# Patient Record
Sex: Female | Born: 1975 | Race: Black or African American | Hispanic: No | Marital: Married
Health system: Southern US, Community
[De-identification: ages and names within clinical notes are randomized; demographics above are authoritative.]

## PROBLEM LIST (undated history)

## (undated) ENCOUNTER — Inpatient Hospital Stay (HOSPITAL_COMMUNITY): Payer: Self-pay

## (undated) DIAGNOSIS — Z789 Other specified health status: Secondary | ICD-10-CM

## (undated) DIAGNOSIS — Z87898 Personal history of other specified conditions: Secondary | ICD-10-CM

## (undated) HISTORY — DX: Personal history of other specified conditions: Z87.898

## (undated) HISTORY — PX: NO PAST SURGERIES: SHX2092

---

## 2003-12-21 ENCOUNTER — Inpatient Hospital Stay (HOSPITAL_COMMUNITY): Admission: AD | Admit: 2003-12-21 | Discharge: 2003-12-21 | Payer: Self-pay | Admitting: Obstetrics & Gynecology

## 2003-12-24 ENCOUNTER — Inpatient Hospital Stay (HOSPITAL_COMMUNITY): Admission: AD | Admit: 2003-12-24 | Discharge: 2003-12-24 | Payer: Self-pay | Admitting: Obstetrics and Gynecology

## 2004-02-11 ENCOUNTER — Inpatient Hospital Stay (HOSPITAL_COMMUNITY): Admission: AD | Admit: 2004-02-11 | Discharge: 2004-02-11 | Payer: Self-pay | Admitting: Obstetrics & Gynecology

## 2004-09-11 ENCOUNTER — Emergency Department (HOSPITAL_COMMUNITY): Admission: EM | Admit: 2004-09-11 | Discharge: 2004-09-11 | Payer: Self-pay | Admitting: Podiatry

## 2008-05-31 ENCOUNTER — Emergency Department (HOSPITAL_COMMUNITY): Admission: EM | Admit: 2008-05-31 | Discharge: 2008-05-31 | Payer: Self-pay | Admitting: Emergency Medicine

## 2012-09-03 ENCOUNTER — Ambulatory Visit (INDEPENDENT_AMBULATORY_CARE_PROVIDER_SITE_OTHER): Payer: Managed Care, Other (non HMO) | Admitting: Family Medicine

## 2012-09-03 ENCOUNTER — Other Ambulatory Visit: Payer: Self-pay | Admitting: Family Medicine

## 2012-09-03 VITALS — BP 122/82 | HR 92 | Temp 98.4°F | Resp 16 | Ht 62.5 in | Wt 160.0 lb

## 2012-09-03 DIAGNOSIS — Z202 Contact with and (suspected) exposure to infections with a predominantly sexual mode of transmission: Secondary | ICD-10-CM

## 2012-09-03 LAB — HEPATITIS B SURFACE ANTIGEN: Hepatitis B Surface Ag: NEGATIVE

## 2012-09-03 MED ORDER — AZITHROMYCIN 500 MG PO TABS: 500.0000 mg | ORAL_TABLET | Freq: Every day | ORAL | Status: DC

## 2012-09-03 NOTE — Progress Notes (Addendum)
36 yo woman wanting STD check.  Partner diagnosed with chlamydia.  Patient has been having some spotting. No fever or abdominal pain.    Objective:  NAD  Vaginal exam: normal; no vulvar lesions, minimal discharge. Bimanual exam: normal  1. Exposure to STD  azithromycin (ZITHROMAX) 500 MG tablet, GC/Chlamydia Amp Probe, Genital, HSV(herpes simplex vrs) 1+2 ab-IgG, RPR, Hepatitis B surface antigen

## 2012-09-03 NOTE — Patient Instructions (Signed)
Chlamydia, Female Chlamydia is an infection caused by a certain type of germ (bacteria). The germs are spread from one person to another person during sexual contact. This infection can be in the cervix, urine tube (urethra), throat, or bottom (rectum). This infection needs treatment. HOME CARE   Take your medicines (antibiotics) as told. Finish them even if you start to feel better.   Only take medicine as told by your doctor.   Tell your sex partner(s) that you have chlamydia. They must also take medicine.   Do not have sex until your doctor says it is okay.   Rest.   Eat healthy. Drink enough fluids to keep your pee (urine) clear or pale yellow.   Keep all doctor visits as told.  GET HELP RIGHT AWAY IF:   Your problems return.   You have a fever.  MAKE SURE YOU:   Understand these instructions.   Will watch your condition.   Will get help right away if you are not doing well or get worse.  Document Released: 09/08/2008 Document Revised: 11/19/2011 Document Reviewed: 09/08/2008 [facility] Patient Information 2012 [place], [place].

## 2012-09-04 LAB — RPR

## 2012-09-05 LAB — GC/CHLAMYDIA PROBE AMP, GENITAL
Chlamydia, DNA Probe: NEGATIVE
GC Probe Amp, Genital: NEGATIVE

## 2012-09-05 LAB — HSV(HERPES SIMPLEX VRS) I + II AB-IGG
HSV 1 Glycoprotein G Ab, IgG: 10.37 IV — ABNORMAL HIGH
HSV 2 Glycoprotein G Ab, IgG: 0.36 IV

## 2012-09-06 ENCOUNTER — Telehealth: Payer: Self-pay

## 2012-09-06 NOTE — Telephone Encounter (Signed)
Dr. Elbert Ewings. 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 Pt's rx expires in Oct and wanted to know if we could write her a new one. Also pt wanted to know if we could add on a HIV test to her labs. She wanted to have everything checked. Please send this message back to the lab pool if that is ok so we can call [facility] to add it on. Thanks

## 2012-09-08 NOTE — Telephone Encounter (Signed)
Currently I am out of town. Ok to refill Valtrex for 1 year Ok to at [facility]

## 2012-09-09 LAB — HIV ANTIBODY (ROUTINE TESTING W REFLEX): HIV: NONREACTIVE

## 2012-09-09 MED ORDER — VALACYCLOVIR HCL 1 G PO TABS: ORAL_TABLET | ORAL | Status: DC

## 2012-09-09 NOTE — Telephone Encounter (Signed)
Spoke with Robin at [facility]. Test added.  

## 2012-09-09 NOTE — Telephone Encounter (Signed)
Called pharmacy to verify pt's current Rx for Valtrex and OK'd RF for 1 year, and entered into EPIC for records.  Lab please add on test per Dr L.

## 2013-09-28 ENCOUNTER — Other Ambulatory Visit: Payer: Self-pay | Admitting: Family Medicine

## 2013-10-14 ENCOUNTER — Other Ambulatory Visit: Payer: Self-pay | Admitting: Family Medicine

## 2013-10-25 ENCOUNTER — Telehealth: Payer: Self-pay | Admitting: Radiology

## 2013-10-25 NOTE — Telephone Encounter (Signed)
The following email was submitted to your website from [facility] hello, I had got a prescription filled with you guys about a year ago for Valtrex and my refills have ran out. When I got the prescription I was told that all I had to do was contact you guys to get another year prescribed. what do I need to do?

## 2013-10-25 NOTE — Telephone Encounter (Signed)
Looking in chart, she was ok'd for RF x 1 year. If she needs more she will need to come in and be seen. LMOM to inform her. When she calls back, please let patient know.  Thanks!  th

## 2013-10-27 NOTE — Telephone Encounter (Signed)
Training Anselm Pancoast. LMOM to call the office regarding her email that we received.

## 2013-10-27 NOTE — Telephone Encounter (Signed)
Patient called wanting a refill on Valtrex. She asked that her prescription be sent to [facility] on [address].  [phone]

## 2013-10-27 NOTE — Telephone Encounter (Signed)
She needs office visit. Called her to advise. She will come in next week.

## 2013-10-29 ENCOUNTER — Ambulatory Visit: Payer: Managed Care, Other (non HMO) | Admitting: Family Medicine

## 2013-10-29 VITALS — BP 122/76 | HR 86 | Temp 98.6°F | Resp 16 | Ht 63.0 in | Wt 182.0 lb

## 2013-10-29 DIAGNOSIS — B009 Herpesviral infection, unspecified: Secondary | ICD-10-CM

## 2013-10-29 DIAGNOSIS — B001 Herpesviral vesicular dermatitis: Secondary | ICD-10-CM

## 2013-10-29 MED ORDER — VALACYCLOVIR HCL 1 G PO TABS: ORAL_TABLET | ORAL | Status: DC

## 2013-10-29 NOTE — Progress Notes (Signed)
[facility]:  Office Visit  Chief Complaint:  Chief Complaint  Patient presents with  . Medication Refill    Valtrex    HPI: Denise Andrade is a 37 y.o. female who is here for  Medication refill meds for fever blisters, she takes it prn x 2-3 doses , no SEs, last major outbreak since on medication was 2 years ago. She has no symptoms currently.   History reviewed. No pertinent past medical history. History reviewed. No pertinent past surgical history. History   Social History  . Marital Status: Single    Spouse Name: N/A    Number of Children: N/A  . Years of Education: N/A   Social History Main Topics  . Smoking status: Never Smoker   . Smokeless tobacco: None  . Alcohol Use: Yes     Comment: social  . Drug Use: None  . Sexual Activity: Yes    Partners: Male   Other Topics Concern  . None   Social History Narrative  . None   Family History  Problem Relation Age of Onset  . Arthritis Mother   . Hyperlipidemia Mother   . ALS Mother   . Thyroid disease Mother   . Heart attack Father    No Known Allergies Prior to Admission medications   Medication Sig Start Date End Date Taking? Authorizing Provider  valACYclovir (VALTREX) 1000 MG tablet Take 2 tablets by mouth twice a day 1 DAY if needed. PATIENT NEEDS OFFICE VISIT FOR ADDITIONAL REFILLS 09/28/13  Yes Elvina Sidle, MD     ROS: The patient denies fevers, chills, night sweats, unintentional weight loss, chest pain, palpitations, wheezing, dyspnea on exertion, nausea, vomiting, abdominal pain, dysuria, hematuria, melena, numbness, weakness, or tingling.   All other systems have been reviewed and were otherwise negative with the exception of those mentioned in the HPI and as above.    PHYSICAL EXAM: Filed Vitals:   10/29/13 1307  BP: 122/76  Pulse: 86  Temp: 98.6 F (37 C)  Resp: 16   Filed Vitals:   10/29/13 1307  Height: 5\' 3"  (1.6 m)  Weight: 182 lb (82.555 kg)   Body mass index  is 32.25 kg/(m^2).  General: Alert, no acute distress HEENT:  Normocephalic, atraumatic, oropharynx patent. EOMI, PERRLA Cardiovascular:  Regular rate and rhythm, no rubs murmurs or gallops.  No Carotid bruits, radial pulse intact. No pedal edema.  Respiratory: Clear to auscultation bilaterally.  No wheezes, rales, or rhonchi.  No cyanosis, no use of accessory musculature GI: No organomegaly, abdomen is soft and non-tender, positive bowel sounds.  No masses. Skin: No rashes. Neurologic: Facial musculature symmetric. Psychiatric: Patient is appropriate throughout our interaction. Lymphatic: No cervical lymphadenopathy Musculoskeletal: Gait intact.   LABS: Results for orders placed in visit on 09/03/12  GC/CHLAMYDIA PROBE AMP, GENITAL      Result Value Range   Chlamydia, DNA Probe NEGATIVE     GC Probe Amp, Genital NEGATIVE    HSV(HERPES SIMPLEX VRS) I + II AB-IGG      Result Value Range   HSV 1 Glycoprotein G Ab, IgG 10.37 (*)    HSV 2 Glycoprotein G Ab, IgG 0.36    RPR      Result Value Range   RPR NON REAC  NON REAC  HEPATITIS B SURFACE ANTIGEN      Result Value Range   Hepatitis B Surface Ag NEGATIVE  NEGATIVE     EKG/XRAY:   Primary read interpreted by Dr. Conley Rolls  at [facility].   ASSESSMENT/PLAN: Encounter Diagnosis  Name Primary?  . Fever blister Yes    Refilled Valtrex May try Lysine otc as prevention as well F/u prn Gross sideeffects, risk and benefits, and alternatives of medications d/w patient. Patient is aware that all medications have potential sideeffects and we are unable to predict every sideeffect or drug-drug interaction that may occur.  Parveen Freehling PHUONG, DO 10/29/2013 1:18 PM

## 2014-03-04 ENCOUNTER — Other Ambulatory Visit: Payer: Self-pay | Admitting: Family Medicine

## 2014-05-10 ENCOUNTER — Telehealth: Payer: Self-pay | Admitting: Nurse Practitioner

## 2014-05-10 DIAGNOSIS — Z30432 Encounter for removal of intrauterine contraceptive device: Secondary | ICD-10-CM

## 2014-05-10 NOTE — Telephone Encounter (Signed)
Spoke with patient. Patient states that she would like to get her IUD removed at this time. Patient states that she has been experiencing "skin breakdown" on her back, shoulders, and chin since she had the IUD placed. Patient has tried treatment for dry skin and acne with no relief. Patient also states that after insertion of the IUD she has been unable to lose weight. Mirena IUD was inserted by Dr.Romine on 10/10/2012. Patient states "I just think my body needs a break and then I will consider other options later." Appointment scheduled with Dr.Lathrop for June 1st at 1300. Patient agreeable to date and time. Order entered for precert.  Routing to provider for final review. Patient agreeable to disposition. Will close encounter  Routing to Dr.Lathrop CC: Lauro Franklin, FNP

## 2014-05-10 NOTE — Telephone Encounter (Signed)
Patient wants to get IUD removed

## 2014-05-14 ENCOUNTER — Encounter: Payer: Self-pay | Admitting: Gynecology

## 2014-05-14 ENCOUNTER — Ambulatory Visit (INDEPENDENT_AMBULATORY_CARE_PROVIDER_SITE_OTHER): Payer: Managed Care, Other (non HMO) | Admitting: Gynecology

## 2014-05-14 VITALS — BP 130/68 | Resp 18 | Ht 63.0 in | Wt 194.0 lb

## 2014-05-14 DIAGNOSIS — Z309 Encounter for contraceptive management, unspecified: Secondary | ICD-10-CM

## 2014-05-14 DIAGNOSIS — Z30432 Encounter for removal of intrauterine contraceptive device: Secondary | ICD-10-CM

## 2014-05-14 MED ORDER — NORETHIN ACE-ETH ESTRAD-FE 1-20 MG-MCG PO TABS: 1.0000 | ORAL_TABLET | Freq: Every day | ORAL | Status: AC

## 2014-05-14 NOTE — Progress Notes (Signed)
38 yrs Hispanic Single [id number]      Presents for Mirena removal.  Denies any vaginal symptoms or STD concerns.  Plans for contraception areunsure.  Pt reports issues with skin breakouts and feeling of breast heaviness.  Pt has had IUD for almost 2y, no cycles. Pt has used yasmin in past but only took for a month.  LMP: none       HPI neg.  Exam: time, place and person Abdomen: soft non-tender Groin:no inguinal nodes palpated    Pelvic exam:Pelvic exam: normal external genitalia, vulva, vagina, cervix, uterus and adnexa.  Procedure: Speculum placed, cervix visualized.  IUD string visualized, grasp with ring forceps, with gentle traction IUD removed intact.  IUD shown to patient and discarded. Speculum removed.   Assessment:Mirena removal Pt tolerated procedure well.  Plan: Begin contraceptive choice oforal contraceptives Rxyes    Questions addressed. We reviewed alternative contraceptive options, pt would like to try ocp-will either start now or wait for cycle-pt aware that removal is associated with immediate return to fertility  Return Visit: 82m

## 2014-05-14 NOTE — Patient Instructions (Addendum)
Begin ocp today or can wait for cycle, if start today, use back-up condoms for first month Start first day of flow

## 2014-09-14 ENCOUNTER — Telehealth: Payer: Self-pay | Admitting: Gynecology

## 2014-09-14 ENCOUNTER — Encounter: Payer: Self-pay | Admitting: Gynecology

## 2014-09-14 ENCOUNTER — Ambulatory Visit: Payer: Managed Care, Other (non HMO) | Admitting: Gynecology

## 2014-09-14 NOTE — Telephone Encounter (Signed)
Patient dnka her recheck appointment with Dr.Lathrop today. I left a message for patient to call and reschedule.

## 2014-09-24 ENCOUNTER — Other Ambulatory Visit: Payer: Self-pay | Admitting: Family Medicine

## 2014-10-15 ENCOUNTER — Encounter: Payer: Self-pay | Admitting: Gynecology

## 2015-04-29 ENCOUNTER — Other Ambulatory Visit: Payer: Self-pay | Admitting: *Deleted

## 2015-04-29 NOTE — Telephone Encounter (Signed)
Faxed Medication refill request: Gildess FE 1/20 Last AEX:  09/22/12 with CR Next AEX: No AEX scheduled  Last MMG (if hormonal medication request): N/A Refill authorized: ?  Left Message To Call Back

## 2015-05-02 NOTE — Telephone Encounter (Signed)
Left Message To Call Back  

## 2015-05-03 NOTE — Telephone Encounter (Signed)
No callback x2 ; Rx denied through epic, patient needs AEX before further refills.

## 2017-11-05 ENCOUNTER — Inpatient Hospital Stay (HOSPITAL_COMMUNITY)
Admission: AD | Admit: 2017-11-05 | Discharge: 2017-11-05 | Disposition: A | Discharge status: Home / Self Care | Payer: 59 | Source: Ambulatory Visit | Attending: Obstetrics and Gynecology | Admitting: Obstetrics and Gynecology

## 2017-11-05 ENCOUNTER — Encounter (HOSPITAL_COMMUNITY): Payer: Self-pay | Admitting: *Deleted

## 2017-11-05 DIAGNOSIS — Z3A33 33 weeks gestation of pregnancy: Secondary | ICD-10-CM | POA: Diagnosis not present

## 2017-11-05 DIAGNOSIS — O26893 Other specified pregnancy related conditions, third trimester: Secondary | ICD-10-CM | POA: Insufficient documentation

## 2017-11-05 DIAGNOSIS — O4703 False labor before 37 completed weeks of gestation, third trimester: Secondary | ICD-10-CM

## 2017-11-05 DIAGNOSIS — R109 Unspecified abdominal pain: Secondary | ICD-10-CM | POA: Insufficient documentation

## 2017-11-05 DIAGNOSIS — O133 Gestational [pregnancy-induced] hypertension without significant proteinuria, third trimester: Secondary | ICD-10-CM | POA: Diagnosis not present

## 2017-11-05 HISTORY — DX: Other specified health status: Z78.9

## 2017-11-05 LAB — URINALYSIS, ROUTINE W REFLEX MICROSCOPIC
Bilirubin Urine: NEGATIVE
GLUCOSE, UA: NEGATIVE mg/dL
Hgb urine dipstick: NEGATIVE
KETONES UR: NEGATIVE mg/dL
LEUKOCYTES UA: NEGATIVE
Nitrite: NEGATIVE
PH: 6 (ref 5.0–8.0)
Protein, ur: NEGATIVE mg/dL
Specific Gravity, Urine: 1.017 (ref 1.005–1.030)

## 2017-11-05 LAB — PROTEIN / CREATININE RATIO, URINE: CREATININE, URINE: 31 mg/dL

## 2017-11-05 LAB — COMPREHENSIVE METABOLIC PANEL
ALT: 27 U/L (ref 14–54)
AST: 41 U/L (ref 15–41)
Albumin: 2.5 g/dL — ABNORMAL LOW (ref 3.5–5.0)
Alkaline Phosphatase: 155 U/L — ABNORMAL HIGH (ref 38–126)
Anion gap: 6 (ref 5–15)
BILIRUBIN TOTAL: 0.4 mg/dL (ref 0.3–1.2)
BUN: 12 mg/dL (ref 6–20)
CHLORIDE: 110 mmol/L (ref 101–111)
CO2: 19 mmol/L — ABNORMAL LOW (ref 22–32)
CREATININE: 0.56 mg/dL (ref 0.44–1.00)
Calcium: 8.8 mg/dL — ABNORMAL LOW (ref 8.9–10.3)
Glucose, Bld: 87 mg/dL (ref 65–99)
Potassium: 3.8 mmol/L (ref 3.5–5.1)
Sodium: 135 mmol/L (ref 135–145)
TOTAL PROTEIN: 6.8 g/dL (ref 6.5–8.1)

## 2017-11-05 LAB — CBC
HEMATOCRIT: 38.9 % (ref 36.0–46.0)
Hemoglobin: 12.8 g/dL (ref 12.0–15.0)
MCH: 30.8 pg (ref 26.0–34.0)
MCHC: 32.9 g/dL (ref 30.0–36.0)
MCV: 93.7 fL (ref 78.0–100.0)
PLATELETS: 233 10*3/uL (ref 150–400)
RBC: 4.15 MIL/uL (ref 3.87–5.11)
RDW: 12.8 % (ref 11.5–15.5)
WBC: 10.3 10*3/uL (ref 4.0–10.5)

## 2017-11-05 LAB — FETAL FIBRONECTIN: Fetal Fibronectin: NEGATIVE

## 2017-11-05 MED ORDER — NIFEDIPINE 10 MG PO CAPS
10.0000 mg | ORAL_CAPSULE | ORAL | Status: DC | PRN
Start: 2017-11-05 — End: 2017-11-05
  Administered 2017-11-05 (×3): 10 mg via ORAL
  Filled 2017-11-05 (×3): qty 1

## 2017-11-05 NOTE — MAU Note (Signed)
Pt C/O lower abd pain, cramping, constant, worse on RLQ, also pressure.  She thinks her baby may have moved, was breech, thinks he may have turned.  Denies bleeding or LOF.

## 2017-11-05 NOTE — Discharge Instructions (Signed)
Preterm Labor and Birth Information The normal length of a pregnancy is 39-41 weeks. Preterm labor is when labor starts before 37 completed weeks of pregnancy. What are the risk factors for preterm labor? Preterm labor is more likely to occur in women who:  Have certain infections during pregnancy such as a bladder infection, sexually transmitted infection, or infection inside the uterus (chorioamnionitis).  Have a shorter-than-normal cervix.  Have gone into preterm labor before.  Have had surgery on their cervix.  Are younger than age 89 or older than age 19.  Are African American.  Are pregnant with twins or multiple babies (multiple gestation).  Take street drugs or smoke while pregnant.  Do not gain enough weight while pregnant.  Became pregnant shortly after having been pregnant.  What are the symptoms of preterm labor? Symptoms of preterm labor include:  Cramps similar to those that can happen during a menstrual period. The cramps may happen with diarrhea.  Pain in the abdomen or lower back.  Regular uterine contractions that may feel like tightening of the abdomen.  A feeling of increased pressure in the pelvis.  Increased watery or bloody mucus discharge from the vagina.  Water breaking (ruptured amniotic sac).  Why is it important to recognize signs of preterm labor? It is important to recognize signs of preterm labor because babies who are born prematurely may not be fully developed. This can put them at an increased risk for:  Long-term (chronic) heart and lung problems.  Difficulty immediately after birth with regulating body systems, including blood sugar, body temperature, heart rate, and breathing rate.  Bleeding in the brain.  Cerebral palsy.  Learning difficulties.  Death.  These risks are highest for babies who are born before 37 weeks of pregnancy. How is preterm labor treated? Treatment depends on the length of your pregnancy, your  condition, and the health of your baby. It may involve:  Having a stitch (suture) placed in your cervix to prevent your cervix from opening too early (cerclage).  Taking or being given medicines, such as: ? Hormone medicines. These may be given early in pregnancy to help support the pregnancy. ? Medicine to stop contractions. ? Medicines to help mature the babys lungs. These may be prescribed if the risk of delivery is high. ? Medicines to prevent your baby from developing cerebral palsy.  If the labor happens before 34 weeks of pregnancy, you may need to stay in the hospital. What should I do if I think I am in preterm labor? If you think that you are going into preterm labor, call your health care provider right away. How can I prevent preterm labor in future pregnancies? To increase your chance of having a full-term pregnancy:  Do not use any tobacco products, such as cigarettes, chewing tobacco, and e-cigarettes. If you need help quitting, ask your health care provider.  Do not use street drugs or medicines that have not been prescribed to you during your pregnancy.  Talk with your health care provider before taking any herbal supplements, even if you have been taking them regularly.  Make sure you gain a healthy amount of weight during your pregnancy.  Watch for infection. If you think that you might have an infection, get it checked right away.  Make sure to tell your health care provider if you have gone into preterm labor before.  This information is not intended to replace advice given to you by your health care provider. Make sure you discuss any questions  you have with your health care provider. Document Released: 02/20/2004 Document Revised: 05/12/2016 Document Reviewed: 04/22/2016 Elsevier Interactive Patient Education  2018 [employer]    Hypertension During Pregnancy Hypertension is also called high blood pressure. High blood pressure means that the force of your  blood moving in your body is too strong. When you are pregnant, this condition should be watched carefully. It can cause problems for you and your baby. Follow these instructions at home: Eating and drinking  Drink enough fluid to keep your pee (urine) clear or pale yellow.  Eat healthy foods that are low in salt (sodium). ? Do not add salt to your food. ? Check labels on foods and drinks to see much salt is in them. Look on the label where you see "Sodium." Lifestyle  Do not use any products that contain nicotine or tobacco, such as cigarettes and e-cigarettes. If you need help quitting, ask your doctor.  Do not use alcohol.  Avoid caffeine.  Avoid stress. Rest and get plenty of sleep. General instructions  Take over-the-counter and prescription medicines only as told by your doctor.  While lying down, lie on your left side. This keeps pressure off your baby.  While sitting or lying down, raise (elevate) your feet. Try putting some pillows under your lower legs.  Exercise regularly. Ask your doctor what kinds of exercise are best for you.  Keep all prenatal and follow-up visits as told by your doctor. This is important. Contact a doctor if:  You have symptoms that your doctor told you to watch for, such as: ? Fever. ? Throwing up (vomiting). ? Headache. Get help right away if:  You have very bad pain in your belly (abdomen).  You are throwing up, and this does not get better with treatment.  You suddenly get swelling in your hands, ankles, or face.  You gain 4 lb (1.8 kg) or more in 1 week.  You get bleeding from your vagina.  You have blood in your pee.  You do not feel your baby moving as much as normal.  You have a change in vision.  You have muscle twitching or sudden tightening (spasms).  You have trouble breathing.  Your lips or fingernails turn blue. This information is not intended to replace advice given to you by your health care provider. Make  sure you discuss any questions you have with your health care provider. Document Released: 01/02/2011 Document Revised: 08/11/2016 Document Reviewed: 08/11/2016 Elsevier Interactive Patient Education  2017 [employer]

## 2017-11-05 NOTE — MAU Provider Note (Signed)
Chief Complaint  Patient presents with  . Abdominal Pain     First Provider Initiated Contact with Patient 11/05/17 0958      S: Marcellus ScottGina L Spizzirri  is a 41 y.o. y.o. year old [id number] female at 391w4d weeks gestation who presents to MAU with low abd and low back cramping and incidentally found to have elevated blood pressures. Having persistent cramp/pressure in right groin that makes it difficult to work. Possible isolated elevated BP at last prenatal visit. Pt doesn's remember number. [facility]. Current blood pressure medication: None  Associated symptoms: Neg for Headache, vision changes, epigastric pain Contractions: Unsure Vaginal bleeding: Denies Fetal movement: Nml  O:  Patient Vitals for the past 24 hrs:  BP Temp Temp src Pulse Resp  11/05/17 1200 112/70 - - 88 -  11/05/17 1145 117/67 - - 86 -  11/05/17 1130 125/70 - - 90 -  11/05/17 1115 124/74 - - 79 -  11/05/17 1108 136/76 - - 71 -  11/05/17 1045 106/66 - - 78 -  11/05/17 1031 (!) 103/57 - - 81 -  11/05/17 1015 126/74 - - 85 -  11/05/17 1006 (!) 142/85 - - 82 -  11/05/17 0934 132/81 - - 90 -  11/05/17 0923 (!) 144/74 98.3 F (36.8 C) Oral 91 16   General: NAD Heart: Regular rate Lungs: Normal rate and effort Abd: Soft, NT, Gravid, S=D Extremities: Neg Pedal edema Neuro: 2+ deep tendon reflexes, No clonus Pelvic: NEFG, no bleeding or LOF.   Dilation: Closed Effacement (%): Thick Cervical Position: Posterior(maternal right) Station: Ballotable Presentation: Vertex(slightly oblique. Head to maternal right. ) Exam by:: Ivonne AndrewV. Tatiyanna Lashley CNM  EFM: 145, Moderate variability, 15 x 15 accelerations, no decelerations Toco: Q2-4 , mild  Results for orders placed or performed during the hospital encounter of 11/05/17 (from the past 24 hour(s))  Urinalysis, Routine w reflex microscopic     Status: Abnormal   Collection Time: 11/05/17  9:00 AM  Result Value Ref Range   Color, Urine AMBER (A) YELLOW   APPearance  CLOUDY (A) CLEAR   Specific Gravity, Urine 1.017 1.005 - 1.030   pH 6.0 5.0 - 8.0   Glucose, UA NEGATIVE NEGATIVE mg/dL   Hgb urine dipstick NEGATIVE NEGATIVE   Bilirubin Urine NEGATIVE NEGATIVE   Ketones, ur NEGATIVE NEGATIVE mg/dL   Protein, ur NEGATIVE NEGATIVE mg/dL   Nitrite NEGATIVE NEGATIVE   Leukocytes, UA NEGATIVE NEGATIVE  Fetal fibronectin     Status: None   Collection Time: 11/05/17 10:05 AM  Result Value Ref Range   Fetal Fibronectin NEGATIVE NEGATIVE  CBC     Status: None   Collection Time: 11/05/17 10:12 AM  Result Value Ref Range   WBC 10.3 4.0 - 10.5 K/uL   RBC 4.15 3.87 - 5.11 MIL/uL   Hemoglobin 12.8 12.0 - 15.0 g/dL   HCT 16.138.9 09.636.0 - 04.546.0 %   MCV 93.7 78.0 - 100.0 fL   MCH 30.8 26.0 - 34.0 pg   MCHC 32.9 30.0 - 36.0 g/dL   RDW 40.912.8 81.111.5 - 91.415.5 %   Platelets 233 150 - 400 K/uL  Comprehensive metabolic panel     Status: Abnormal   Collection Time: 11/05/17 10:12 AM  Result Value Ref Range   Sodium 135 135 - 145 mmol/L   Potassium 3.8 3.5 - 5.1 mmol/L   Chloride 110 101 - 111 mmol/L   CO2 19 (L) 22 - 32 mmol/L   Glucose, Bld 87  65 - 99 mg/dL   BUN 12 6 - 20 mg/dL   Creatinine, Ser 8.650.56 0.44 - 1.00 mg/dL   Calcium 8.8 (L) 8.9 - 10.3 mg/dL   Total Protein 6.8 6.5 - 8.1 g/dL   Albumin 2.5 (L) 3.5 - 5.0 g/dL   AST 41 15 - 41 U/L   ALT 27 14 - 54 U/L   Alkaline Phosphatase 155 (H) 38 - 126 U/L   Total Bilirubin 0.4 0.3 - 1.2 mg/dL   GFR calc non Af Amer >60 >60 mL/min   GFR calc Af Amer >60 >60 mL/min   Anion gap 6 5 - 15  Protein / creatinine ratio, urine     Status: None   Collection Time: 11/05/17 11:00 AM  Result Value Ref Range   Creatinine, Urine 31.00 mg/dL   Total Protein, Urine <6 mg/dL   Protein Creatinine Ratio        0.00 - 0.15 mg/mg[Cre]   MAU Course/MDM Orders Placed This Encounter  Procedures  . Urinalysis, Routine w reflex microscopic  . Fetal fibronectin  . CBC  . Comprehensive metabolic panel  . Protein / creatinine ratio,  urine  . Discharge patient   Meds ordered this encounter  Medications  . NIFEdipine (PROCARDIA) capsule 10 mg for contractions (given after multiple BP's taken)   A: 9018w4d week IUP Preterm contractions w/ out evidence of active preterm labor. fFN neg.  Transient hypertension in pregnancy w/out evidence of Pre-E FHR reactive  P: Discharge home in stable condition Preeclampsia precautions. PTL precautions.  Increase fluids and rest [facility] sooner as needed if symptoms worsen. Return to maternity admissions as needed in emergencies  [place], [place], [place] 11/05/2017 12:15 PM

## 2017-11-15 LAB — OB RESULTS CONSOLE GC/CHLAMYDIA
Chlamydia: NEGATIVE
GC PROBE AMP, GENITAL: NEGATIVE

## 2017-12-14 ENCOUNTER — Encounter (HOSPITAL_COMMUNITY): Payer: Self-pay

## 2017-12-14 ENCOUNTER — Other Ambulatory Visit: Payer: Self-pay

## 2017-12-14 ENCOUNTER — Inpatient Hospital Stay (HOSPITAL_COMMUNITY)
Admission: AD | Admit: 2017-12-14 | Discharge: 2017-12-14 | Disposition: A | Discharge status: Home / Self Care | Payer: 59 | Source: Ambulatory Visit | Attending: Family Medicine | Admitting: Family Medicine

## 2017-12-14 DIAGNOSIS — R51 Headache: Secondary | ICD-10-CM | POA: Diagnosis present

## 2017-12-14 DIAGNOSIS — Z87891 Personal history of nicotine dependence: Secondary | ICD-10-CM | POA: Insufficient documentation

## 2017-12-14 DIAGNOSIS — Z3A39 39 weeks gestation of pregnancy: Secondary | ICD-10-CM | POA: Insufficient documentation

## 2017-12-14 DIAGNOSIS — G44209 Tension-type headache, unspecified, not intractable: Secondary | ICD-10-CM

## 2017-12-14 DIAGNOSIS — O9989 Other specified diseases and conditions complicating pregnancy, childbirth and the puerperium: Secondary | ICD-10-CM | POA: Diagnosis not present

## 2017-12-14 DIAGNOSIS — O26893 Other specified pregnancy related conditions, third trimester: Secondary | ICD-10-CM | POA: Diagnosis not present

## 2017-12-14 LAB — CBC WITH DIFFERENTIAL/PLATELET
BASOS ABS: 0 10*3/uL (ref 0.0–0.1)
BASOS PCT: 0 %
EOS ABS: 0.1 10*3/uL (ref 0.0–0.7)
EOS PCT: 1 %
HCT: 37.3 % (ref 36.0–46.0)
Hemoglobin: 12.4 g/dL (ref 12.0–15.0)
Lymphocytes Relative: 20 %
Lymphs Abs: 2 10*3/uL (ref 0.7–4.0)
MCH: 30.5 pg (ref 26.0–34.0)
MCHC: 33.2 g/dL (ref 30.0–36.0)
MCV: 91.6 fL (ref 78.0–100.0)
MONO ABS: 0.4 10*3/uL (ref 0.1–1.0)
MONOS PCT: 4 %
Neutro Abs: 7.6 10*3/uL (ref 1.7–7.7)
Neutrophils Relative %: 75 %
PLATELETS: 261 10*3/uL (ref 150–400)
RBC: 4.07 MIL/uL (ref 3.87–5.11)
RDW: 13.4 % (ref 11.5–15.5)
WBC: 10.2 10*3/uL (ref 4.0–10.5)

## 2017-12-14 LAB — URINALYSIS, ROUTINE W REFLEX MICROSCOPIC
BILIRUBIN URINE: NEGATIVE
GLUCOSE, UA: NEGATIVE mg/dL
Hgb urine dipstick: NEGATIVE
KETONES UR: NEGATIVE mg/dL
LEUKOCYTES UA: NEGATIVE
Nitrite: NEGATIVE
PH: 5 (ref 5.0–8.0)
PROTEIN: NEGATIVE mg/dL
Specific Gravity, Urine: 1.023 (ref 1.005–1.030)

## 2017-12-14 LAB — COMPREHENSIVE METABOLIC PANEL
ALBUMIN: 2.5 g/dL — AB (ref 3.5–5.0)
ALT: 28 U/L (ref 14–54)
ANION GAP: 11 (ref 5–15)
AST: 21 U/L (ref 15–41)
Alkaline Phosphatase: 236 U/L — ABNORMAL HIGH (ref 38–126)
BUN: 11 mg/dL (ref 6–20)
CHLORIDE: 106 mmol/L (ref 101–111)
CO2: 19 mmol/L — AB (ref 22–32)
Calcium: 8.7 mg/dL — ABNORMAL LOW (ref 8.9–10.3)
Creatinine, Ser: 0.74 mg/dL (ref 0.44–1.00)
GFR calc Af Amer: >60 mL/min (ref >60)
GFR calc non Af Amer: >60 mL/min (ref >60)
GLUCOSE: 86 mg/dL (ref 65–99)
POTASSIUM: 3.7 mmol/L (ref 3.5–5.1)
SODIUM: 136 mmol/L (ref 135–145)
Total Bilirubin: 0.4 mg/dL (ref 0.3–1.2)
Total Protein: 6.9 g/dL (ref 6.5–8.1)

## 2017-12-14 LAB — PROTEIN / CREATININE RATIO, URINE
Creatinine, Urine: 157 mg/dL
PROTEIN CREATININE RATIO: 0.13 mg/mg{creat} (ref 0.00–0.15)
Total Protein, Urine: 21 mg/dL

## 2017-12-14 MED ORDER — ACETAMINOPHEN 500 MG PO TABS
1000.0000 mg | ORAL_TABLET | Freq: Once | ORAL | Status: AC
  Administered 2017-12-14: 1000 mg via ORAL
  Filled 2017-12-14: qty 2

## 2017-12-14 NOTE — MAU Provider Note (Signed)
History     CSN: [id number]  Arrival date and time: 12/14/17 1759   First Provider Initiated Contact with Patient 12/14/17 1825      Chief Complaint  Patient presents with  . Headache   HPI [name] is a 42 y.o. [phone] at [redacted]w[redacted]d who presents to MAU today with complaint of headache. She states that they have been "watching" her blood pressure in the office recently. She goes to the [facility] for prenatal care. She rates pain at 5/10. Present since this morning. She has not taken anything for pain. She denies blurred vision, RUQ abdominal pain or edema. She denies vaginal bleeding, LOF or regular contractions. She reports normal fetal movement.   OB History    Gravida Para Term Preterm AB Living   4 3 3     3    SAB TAB Ectopic Multiple Live Births                  Past Medical History:  Diagnosis Date  . Medical history non-contributory     Past Surgical History:  Procedure Laterality Date  . NO PAST SURGERIES      History reviewed. No pertinent family history.  Social History   Tobacco Use  . Smoking status: Former Games developer  . Smokeless tobacco: Never Used  Substance Use Topics  . Alcohol use: No    Frequency: Never  . Drug use: No    Allergies: No Known Allergies  Medications Prior to Admission  Medication Sig Dispense Refill Last Dose  . Prenat-FeFum-FePo-FA-Omega 3 (VIRT-C DHA) 53.5-38-1 MG CAPS Take 1 capsule by mouth daily.   12/14/2017 at Unknown time  . acetaminophen (TYLENOL) 325 MG tablet Take 325 mg by mouth every 6 (six) hours as needed for mild pain or headache.    11/04/2017 at Unknown time    Review of Systems  Constitutional: Negative for fever.  Eyes: Negative for visual disturbance.  Cardiovascular: Negative for leg swelling.  Gastrointestinal: Negative for abdominal pain, constipation, diarrhea, nausea and vomiting.  Neurological: Negative for headaches.   Physical Exam   Blood pressure 122/78, pulse 94, temperature 98.3 F (36.8 C),  resp. rate 18, height 5\' 2"  (1.575 m), weight 233 lb (105.7 kg), SpO2 100 %.  Physical Exam  Nursing note and vitals reviewed. Constitutional: She is oriented to person, place, and time. She appears well-developed and well-nourished. No distress.  HENT:  Head: Normocephalic and atraumatic.  Cardiovascular: Normal rate.  Respiratory: Effort normal.  GI: Soft. She exhibits no distension and no mass. There is no tenderness. There is no rebound and no guarding.  Musculoskeletal: She exhibits no edema.  Neurological: She is alert and oriented to person, place, and time. She has normal reflexes.  No clonus  Skin: Skin is warm and dry. No erythema.  Psychiatric: She has a normal mood and affect.    Results for orders placed or performed during the hospital encounter of 12/14/17 (from the past 24 hour(s))  Urinalysis, Routine w reflex microscopic     Status: Abnormal   Collection Time: 12/14/17  6:03 PM  Result Value Ref Range   Color, Urine YELLOW YELLOW   APPearance HAZY (A) CLEAR   Specific Gravity, Urine 1.023 1.005 - 1.030   pH 5.0 5.0 - 8.0   Glucose, UA NEGATIVE NEGATIVE mg/dL   Hgb urine dipstick NEGATIVE NEGATIVE   Bilirubin Urine NEGATIVE NEGATIVE   Ketones, ur NEGATIVE NEGATIVE mg/dL   Protein, ur NEGATIVE NEGATIVE mg/dL  Nitrite NEGATIVE NEGATIVE   Leukocytes, UA NEGATIVE NEGATIVE  Protein / creatinine ratio, urine     Status: None   Collection Time: 12/14/17  6:03 PM  Result Value Ref Range   Creatinine, Urine 157.00 mg/dL   Total Protein, Urine 21 mg/dL   Protein Creatinine Ratio 0.13 0.00 - 0.15 mg/mg[Cre]  CBC with Differential/Platelet     Status: None   Collection Time: 12/14/17  6:30 PM  Result Value Ref Range   WBC 10.2 4.0 - 10.5 K/uL   RBC 4.07 3.87 - 5.11 MIL/uL   Hemoglobin 12.4 12.0 - 15.0 g/dL   HCT 16.1 09.6 - 04.5 %   MCV 91.6 78.0 - 100.0 fL   MCH 30.5 26.0 - 34.0 pg   MCHC 33.2 30.0 - 36.0 g/dL   RDW 40.9 81.1 - 91.4 %   Platelets 261 150 - 400  K/uL   Neutrophils Relative % 75 %   Neutro Abs 7.6 1.7 - 7.7 K/uL   Lymphocytes Relative 20 %   Lymphs Abs 2.0 0.7 - 4.0 K/uL   Monocytes Relative 4 %   Monocytes Absolute 0.4 0.1 - 1.0 K/uL   Eosinophils Relative 1 %   Eosinophils Absolute 0.1 0.0 - 0.7 K/uL   Basophils Relative 0 %   Basophils Absolute 0.0 0.0 - 0.1 K/uL  Comprehensive metabolic panel     Status: Abnormal   Collection Time: 12/14/17  6:30 PM  Result Value Ref Range   Sodium 136 135 - 145 mmol/L   Potassium 3.7 3.5 - 5.1 mmol/L   Chloride 106 101 - 111 mmol/L   CO2 19 (L) 22 - 32 mmol/L   Glucose, Bld 86 65 - 99 mg/dL   BUN 11 6 - 20 mg/dL   Creatinine, Ser 7.82 0.44 - 1.00 mg/dL   Calcium 8.7 (L) 8.9 - 10.3 mg/dL   Total Protein 6.9 6.5 - 8.1 g/dL   Albumin 2.5 (L) 3.5 - 5.0 g/dL   AST 21 15 - 41 U/L   ALT 28 14 - 54 U/L   Alkaline Phosphatase 236 (H) 38 - 126 U/L   Total Bilirubin 0.4 0.3 - 1.2 mg/dL   GFR calc non Af Amer >60 >60 mL/min   GFR calc Af Amer >60 >60 mL/min   Anion gap 11 5 - 15   Patient Vitals for the past 24 hrs:  BP Temp Pulse Resp SpO2 Height Weight  12/14/17 1916 122/78 - 94 - - - -  12/14/17 1901 131/79 - 89 - - - -  12/14/17 1846 129/80 - 94 - - - -  12/14/17 1831 134/83 - (!) 102 - - - -  12/14/17 1818 136/78 - 99 - - - -  12/14/17 1816 133/85 - 99 - - - -  12/14/17 1815 136/78 - (!) 101 - - - 233 lb (105.7 kg)  12/14/17 1813 135/87 98.3 F (36.8 C) (!) 102 18 100 % 5\' 2"  (1.575 m) -   Fetal Monitoring: Baseline: 130 bpm Variability: moderate Accelerations: 15 x 15 Decelerations: none Contractions: few, irregular   MAU Course  Procedures None  MDM UA, CBC, CMP and urine protein/creatinine ratio today  Serial BPs 1 G Tylenol for headache given  Labs do not indicate pre-eclampsia today Patient states still has mild headache. Fioricet offered vs. Home for rest and heat therapy. Patient would prefer discharge at this time and declines additional medications.   Assessment and Plan  A: SIUP at [redacted]w[redacted]d Headache, tension-type  P: Discharge home Tylenol PRN for pain advised Pre-eclampsia precautions discussed Patient advised to follow-up with [facility] as scheduled for routine prenatal care or sooner PRN Patient may return to MAU as needed or if her condition were to change or worsen  Vonzella NippleJulie Camera Krienke, PA-C 12/14/2017, 7:27 PM

## 2017-12-14 NOTE — Discharge Instructions (Signed)
Tension Headache A tension headache is pain, pressure, or aching that is felt over the front and sides of your head. These headaches can last from 30 minutes to several days. Follow these instructions at home: Managing pain  Take over-the-counter and prescription medicines only as told by your doctor.  Lie down in a dark, quiet room when you have a headache.  If directed, apply ice to your head and neck area: ? Put ice in a plastic bag. ? Place a towel between your skin and the bag. ? Leave the ice on for 20 minutes, 2-3 times per day.  Use a heating pad or a hot shower to apply heat to your head and neck area as told by your doctor. Eating and drinking  Eat meals on a regular schedule.  Do not drink a lot of alcohol.  Do not use a lot of caffeine, or stop using caffeine. General instructions  Keep all follow-up visits as told by your doctor. This is important.  Keep a journal to find out if certain things bring on headaches. For example, write down: ? What you eat and drink. ? How much sleep you get. ? Any change to your diet or medicines.  Try getting a massage, or doing other things that help you to relax.  Lessen stress.  Sit up straight. Do not tighten (tense) your muscles.  Do not use tobacco products. This includes cigarettes, chewing tobacco, or e-cigarettes. If you need help quitting, ask your doctor.  Exercise regularly as told by your doctor.  Get enough sleep. This may mean 7-9 hours of sleep. Contact a doctor if:  Your symptoms are not helped by medicine.  You have a headache that feels different from your usual headache.  You feel sick to your stomach (nauseous) or you throw up (vomit).  You have a fever. Get help right away if:  Your headache becomes very bad.  You keep throwing up.  You have a stiff neck.  You have trouble seeing.  You have trouble speaking.  You have pain in your eye or ear.  Your muscles are weak or you lose muscle  control.  You lose your balance or you have trouble walking.  You feel like you will pass out (faint) or you pass out.  You have confusion. This information is not intended to replace advice given to you by your health care provider. Make sure you discuss any questions you have with your health care provider. Document Released: 02/24/2010 Document Revised: 07/30/2016 Document Reviewed: 03/25/2015 Elsevier Interactive Patient Education  2018 [employer].  Preeclampsia and Eclampsia Preeclampsia is a serious condition that develops only during pregnancy. It is also called toxemia of pregnancy. This condition causes high blood pressure along with other symptoms, such as swelling and headaches. These symptoms may develop as the condition gets worse. Preeclampsia may occur at 20 weeks of pregnancy or later. Diagnosing and treating preeclampsia early is very important. If not treated early, it can cause serious problems for you and your baby. One problem it can lead to is eclampsia, which is a condition that causes muscle jerking or shaking (convulsions or seizures) in the mother. Delivering your baby is the best treatment for preeclampsia or eclampsia. Preeclampsia and eclampsia symptoms usually go away after your baby is born. What are the causes? The cause of preeclampsia is not known. What increases the risk? The following risk factors make you more likely to develop preeclampsia:  Being pregnant for the first time.  Having had preeclampsia during a past pregnancy. °· Having a family history of preeclampsia. °· Having high blood pressure. °· Being pregnant with twins or triplets. °· Being 35 or older. °· Being African-American. °· Having kidney disease or diabetes. °· Having medical conditions such as lupus or blood diseases. °· Being very overweight (obese). ° °What are the signs or symptoms? °The earliest signs of preeclampsia are: °· High blood pressure. °· Increased protein in your urine.  Your health care provider will check for this at every visit before you give birth (prenatal visit). ° °Other symptoms that may develop as the condition gets worse include: °· Severe headaches. °· Sudden weight gain. °· Swelling of the hands, face, legs, and feet. °· Nausea and vomiting. °· Vision problems, such as blurred or double vision. °· Numbness in the face, arms, legs, and feet. °· Urinating less than usual. °· Dizziness. °· Slurred speech. °· Abdominal pain, especially upper abdominal pain. °· Convulsions or seizures. ° °Symptoms generally go away after giving birth. °How is this diagnosed? °There are no screening tests for preeclampsia. Your health care provider will ask you about symptoms and check for signs of preeclampsia during your prenatal visits. You may also have tests that include: °· Urine tests. °· Blood tests. °· Checking your blood pressure. °· Monitoring your baby’s heart rate. °· Ultrasound. ° °How is this treated? °You and your health care provider will determine the treatment approach that is best for you. Treatment may include: °· Having more frequent prenatal exams to check for signs of preeclampsia, if you have an increased risk for preeclampsia. °· Bed rest. °· Reducing how much salt (sodium) you eat. °· Medicine to lower your blood pressure. °· Staying in the hospital, if your condition is severe. There, treatment will focus on controlling your blood pressure and the amount of fluids in your body (fluid retention). °· You may need to take medicine (magnesium sulfate) to prevent seizures. This medicine may be given as an injection or through an IV tube. °· Delivering your baby early, if your condition gets worse. You may have your labor started with medicine (induced), or you may have a cesarean delivery. ° °Follow these instructions at home: °Eating and drinking ° °· Drink enough fluid to keep your urine clear or pale yellow. °· Eat a healthy diet that is low in sodium. Do not add  salt to your food. Check nutrition labels to see how much sodium a food or beverage contains. °· Avoid caffeine. °Lifestyle °· Do not use any products that contain nicotine or tobacco, such as cigarettes and e-cigarettes. If you need help quitting, ask your health care provider. °· Do not use alcohol or drugs. °· Avoid stress as much as possible. Rest and get plenty of sleep. °General instructions °· Take over-the-counter and prescription medicines only as told by your health care provider. °· When lying down, lie on your side. This keeps pressure off of your baby. °· When sitting or lying down, raise (elevate) your feet. Try putting some pillows underneath your lower legs. °· Exercise regularly. Ask your health care provider what kinds of exercise are best for you. °· Keep all follow-up and prenatal visits as told by your health care provider. This is important. °How is this prevented? °To prevent preeclampsia or eclampsia from developing during another pregnancy: °· Get proper medical care during pregnancy. Your health care provider may be able to prevent preeclampsia or diagnose and treat it early. °· Your health care provider   may have you take a low-dose aspirin or a calcium supplement during your next pregnancy.  You may have tests of your blood pressure and kidney function after giving birth.  Maintain a healthy weight. Ask your health care provider for help managing weight gain during pregnancy.  Work with your health care provider to manage any long-term (chronic) health conditions you have, such as diabetes or kidney problems.  Contact a health care provider if:  You gain more weight than expected.  You have headaches.  You have nausea or vomiting.  You have abdominal pain.  You feel dizzy or light-headed. Get help right away if:  You develop sudden or severe swelling anywhere in your body. This usually happens in the legs.  You gain 5 lbs (2.3 kg) or more during one week.  You have  severe: ? Abdominal pain. ? Headaches. ? Dizziness. ? Vision problems. ? Confusion. ? Nausea or vomiting.  You have a seizure.  You have trouble moving any part of your body.  You develop numbness in any part of your body.  You have trouble speaking.  You have any abnormal bleeding.  You pass out. This information is not intended to replace advice given to you by your health care provider. Make sure you discuss any questions you have with your health care provider. Document Released: 11/27/2000 Document Revised: 07/28/2016 Document Reviewed: 07/06/2016 Elsevier Interactive Patient Education  [employer]2018 Elsevier Inc.

## 2017-12-14 NOTE — MAU Note (Signed)
Presents to mau with c/o headache all day long. Has been watching blood pressure at md office.  Did not take any blood pressures today.  Did not take any medication for headache.  +fm, denies lof, vaginal bleeding

## 2017-12-20 ENCOUNTER — Encounter (HOSPITAL_COMMUNITY)
Admission: AD | Disposition: A | Discharge status: Home / Self Care | Payer: Self-pay | Source: Ambulatory Visit | Attending: Obstetrics and Gynecology

## 2017-12-20 ENCOUNTER — Inpatient Hospital Stay (HOSPITAL_COMMUNITY)
Admission: AD | Admit: 2017-12-20 | Discharge: 2017-12-22 | DRG: 788 | Disposition: A | Discharge status: Home / Self Care | Payer: 59 | Source: Ambulatory Visit | Attending: Obstetrics and Gynecology | Admitting: Obstetrics and Gynecology

## 2017-12-20 ENCOUNTER — Inpatient Hospital Stay (HOSPITAL_COMMUNITY): Payer: 59 | Admitting: Anesthesiology

## 2017-12-20 ENCOUNTER — Encounter (HOSPITAL_COMMUNITY): Payer: Self-pay | Admitting: *Deleted

## 2017-12-20 DIAGNOSIS — Z98891 History of uterine scar from previous surgery: Secondary | ICD-10-CM

## 2017-12-20 DIAGNOSIS — O99824 Streptococcus B carrier state complicating childbirth: Secondary | ICD-10-CM | POA: Diagnosis present

## 2017-12-20 DIAGNOSIS — O09523 Supervision of elderly multigravida, third trimester: Secondary | ICD-10-CM

## 2017-12-20 DIAGNOSIS — Z87891 Personal history of nicotine dependence: Secondary | ICD-10-CM | POA: Diagnosis not present

## 2017-12-20 DIAGNOSIS — O99214 Obesity complicating childbirth: Secondary | ICD-10-CM | POA: Diagnosis present

## 2017-12-20 DIAGNOSIS — O4292 Full-term premature rupture of membranes, unspecified as to length of time between rupture and onset of labor: Principal | ICD-10-CM | POA: Diagnosis present

## 2017-12-20 DIAGNOSIS — Z3A40 40 weeks gestation of pregnancy: Secondary | ICD-10-CM

## 2017-12-20 DIAGNOSIS — Z3A38 38 weeks gestation of pregnancy: Secondary | ICD-10-CM

## 2017-12-20 LAB — CBC
HCT: 36.8 % (ref 36.0–46.0)
Hemoglobin: 12.4 g/dL (ref 12.0–15.0)
MCH: 30.4 pg (ref 26.0–34.0)
MCHC: 33.7 g/dL (ref 30.0–36.0)
MCV: 90.2 fL (ref 78.0–100.0)
PLATELETS: 257 10*3/uL (ref 150–400)
RBC: 4.08 MIL/uL (ref 3.87–5.11)
RDW: 13.5 % (ref 11.5–15.5)
WBC: 11.8 10*3/uL — ABNORMAL HIGH (ref 4.0–10.5)

## 2017-12-20 LAB — RPR: RPR: NONREACTIVE

## 2017-12-20 LAB — OB RESULTS CONSOLE RUBELLA ANTIBODY, IGM: RUBELLA: IMMUNE

## 2017-12-20 LAB — TYPE AND SCREEN
ABO/RH(D): O POS
Antibody Screen: NEGATIVE

## 2017-12-20 LAB — OB RESULTS CONSOLE RPR: RPR: NONREACTIVE

## 2017-12-20 LAB — OB RESULTS CONSOLE GBS: STREP GROUP B AG: POSITIVE

## 2017-12-20 LAB — OB RESULTS CONSOLE HEPATITIS B SURFACE ANTIGEN: Hepatitis B Surface Ag: NEGATIVE

## 2017-12-20 LAB — OB RESULTS CONSOLE HIV ANTIBODY (ROUTINE TESTING): HIV: NONREACTIVE

## 2017-12-20 LAB — POCT FERN TEST: POCT Fern Test: POSITIVE

## 2017-12-20 LAB — ABO/RH: ABO/RH(D): O POS

## 2017-12-20 SURGERY — Surgical Case
Anesthesia: Regional | Site: Abdomen | Wound class: Clean Contaminated

## 2017-12-20 MED ORDER — TERBUTALINE SULFATE 1 MG/ML IJ SOLN
0.2500 mg | Freq: Once | INTRAMUSCULAR | Status: AC | PRN
  Administered 2017-12-20: 0.25 mg via SUBCUTANEOUS
  Filled 2017-12-20: qty 1

## 2017-12-20 MED ORDER — COCONUT OIL OIL: 1.0000 "application " | TOPICAL_OIL | Status: DC | PRN

## 2017-12-20 MED ORDER — SODIUM CHLORIDE 0.9 % IR SOLN
Status: DC | PRN
  Administered 2017-12-20: 1

## 2017-12-20 MED ORDER — OXYTOCIN 10 UNIT/ML IJ SOLN
INTRAVENOUS | Status: DC | PRN
  Administered 2017-12-20: 40 [IU] via INTRAVENOUS

## 2017-12-20 MED ORDER — ONDANSETRON HCL 4 MG/2ML IJ SOLN
INTRAMUSCULAR | Status: AC
  Filled 2017-12-20: qty 2

## 2017-12-20 MED ORDER — OXYTOCIN 40 UNITS IN LACTATED RINGERS INFUSION - SIMPLE MED
1.0000 m[IU]/min | INTRAVENOUS | Status: DC
  Administered 2017-12-20: 2 m[IU]/min via INTRAVENOUS
  Filled 2017-12-20: qty 1000

## 2017-12-20 MED ORDER — ACETAMINOPHEN 325 MG PO TABS
650.0000 mg | ORAL_TABLET | ORAL | Status: DC | PRN
  Administered 2017-12-22: 650 mg via ORAL
  Filled 2017-12-20: qty 2

## 2017-12-20 MED ORDER — CHLOROPROCAINE HCL (PF) 3 % IJ SOLN
INTRAMUSCULAR | Status: DC | PRN
  Administered 2017-12-20: 20 mL

## 2017-12-20 MED ORDER — SCOPOLAMINE 1 MG/3DAYS TD PT72: 1.0000 | MEDICATED_PATCH | Freq: Once | TRANSDERMAL | Status: DC

## 2017-12-20 MED ORDER — SCOPOLAMINE 1 MG/3DAYS TD PT72
MEDICATED_PATCH | TRANSDERMAL | Status: DC | PRN
  Administered 2017-12-20: 1 via TRANSDERMAL

## 2017-12-20 MED ORDER — SODIUM BICARBONATE 8.4 % IV SOLN
INTRAVENOUS | Status: DC | PRN
  Administered 2017-12-20 (×2): 5 mL via EPIDURAL

## 2017-12-20 MED ORDER — LACTATED RINGERS IV SOLN
500.0000 mL | Freq: Once | INTRAVENOUS | Status: AC
  Administered 2017-12-20: 500 mL via INTRAVENOUS

## 2017-12-20 MED ORDER — SIMETHICONE 80 MG PO CHEW
80.0000 mg | CHEWABLE_TABLET | Freq: Three times a day (TID) | ORAL | Status: DC
  Administered 2017-12-21 – 2017-12-22 (×5): 80 mg via ORAL
  Filled 2017-12-20 (×6): qty 1

## 2017-12-20 MED ORDER — MORPHINE SULFATE (PF) 0.5 MG/ML IJ SOLN
INTRAMUSCULAR | Status: DC | PRN
  Administered 2017-12-20: 2 mg via INTRAVENOUS
  Administered 2017-12-20: 3 mg via EPIDURAL

## 2017-12-20 MED ORDER — LACTATED RINGERS IV SOLN
INTRAVENOUS | Status: DC
  Administered 2017-12-20 (×2): via INTRAVENOUS

## 2017-12-20 MED ORDER — PENICILLIN G POTASSIUM 5000000 UNITS IJ SOLR
5.0000 10*6.[IU] | Freq: Once | INTRAVENOUS | Status: AC
  Administered 2017-12-20: 5 10*6.[IU] via INTRAVENOUS
  Filled 2017-12-20: qty 5

## 2017-12-20 MED ORDER — ONDANSETRON HCL 4 MG/2ML IJ SOLN: 4.0000 mg | Freq: Three times a day (TID) | INTRAMUSCULAR | Status: DC | PRN

## 2017-12-20 MED ORDER — OXYCODONE HCL 5 MG PO TABS: 10.0000 mg | ORAL_TABLET | ORAL | Status: DC | PRN

## 2017-12-20 MED ORDER — FENTANYL CITRATE (PF) 100 MCG/2ML IJ SOLN
INTRAMUSCULAR | Status: DC | PRN
  Administered 2017-12-20: 50 ug via INTRAVENOUS

## 2017-12-20 MED ORDER — POLYETHYLENE GLYCOL 3350 17 G PO PACK
17.0000 g | PACK | Freq: Every day | ORAL | Status: DC
  Administered 2017-12-21 – 2017-12-22 (×2): 17 g via ORAL
  Filled 2017-12-20 (×3): qty 1

## 2017-12-20 MED ORDER — PHENYLEPHRINE 40 MCG/ML (10ML) SYRINGE FOR IV PUSH (FOR BLOOD PRESSURE SUPPORT): 80.0000 ug | PREFILLED_SYRINGE | INTRAVENOUS | Status: DC | PRN

## 2017-12-20 MED ORDER — DIPHENHYDRAMINE HCL 50 MG/ML IJ SOLN: 12.5000 mg | INTRAMUSCULAR | Status: DC | PRN

## 2017-12-20 MED ORDER — SENNOSIDES-DOCUSATE SODIUM 8.6-50 MG PO TABS
2.0000 | ORAL_TABLET | Freq: Every evening | ORAL | Status: DC | PRN
  Filled 2017-12-20: qty 2

## 2017-12-20 MED ORDER — CEFAZOLIN SODIUM-DEXTROSE 2-3 GM-%(50ML) IV SOLR
INTRAVENOUS | Status: DC | PRN
Start: 2017-12-20 — End: 2017-12-20
  Administered 2017-12-20: 2 g via INTRAVENOUS

## 2017-12-20 MED ORDER — PRENATAL MULTIVITAMIN CH
1.0000 | ORAL_TABLET | Freq: Every day | ORAL | Status: DC
  Administered 2017-12-21 – 2017-12-22 (×2): 1 via ORAL
  Filled 2017-12-20 (×2): qty 1

## 2017-12-20 MED ORDER — OXYTOCIN 10 UNIT/ML IJ SOLN
INTRAMUSCULAR | Status: AC
  Filled 2017-12-20: qty 4

## 2017-12-20 MED ORDER — ACETAMINOPHEN 500 MG PO TABS
1000.0000 mg | ORAL_TABLET | Freq: Four times a day (QID) | ORAL | Status: AC
  Administered 2017-12-21 (×3): 1000 mg via ORAL
  Filled 2017-12-20 (×3): qty 2

## 2017-12-20 MED ORDER — CHLOROPROCAINE HCL (PF) 3 % IJ SOLN
INTRAMUSCULAR | Status: AC
Start: 2017-12-20 — End: 2017-12-20
  Filled 2017-12-20: qty 20

## 2017-12-20 MED ORDER — KETOROLAC TROMETHAMINE 30 MG/ML IJ SOLN
INTRAMUSCULAR | Status: AC
  Administered 2017-12-21: 30 mg via INTRAVENOUS
  Filled 2017-12-20: qty 1

## 2017-12-20 MED ORDER — LACTATED RINGERS IV SOLN
500.0000 mL | INTRAVENOUS | Status: DC | PRN
  Administered 2017-12-20: 1000 mL via INTRAVENOUS
  Administered 2017-12-20: 500 mL via INTRAVENOUS

## 2017-12-20 MED ORDER — PHENYLEPHRINE HCL 10 MG/ML IJ SOLN
INTRAMUSCULAR | Status: DC | PRN
  Administered 2017-12-20 (×4): 80 ug via INTRAVENOUS

## 2017-12-20 MED ORDER — MORPHINE SULFATE (PF) 0.5 MG/ML IJ SOLN
INTRAMUSCULAR | Status: AC
  Filled 2017-12-20: qty 10

## 2017-12-20 MED ORDER — CEFAZOLIN SODIUM-DEXTROSE 2-4 GM/100ML-% IV SOLN: 2.0000 g | Freq: Once | INTRAVENOUS | Status: DC

## 2017-12-20 MED ORDER — LIDOCAINE HCL (PF) 1 % IJ SOLN: 30.0000 mL | INTRAMUSCULAR | Status: DC | PRN

## 2017-12-20 MED ORDER — DEXTROSE 5 % IV SOLN
1000.0000 mg | Freq: Once | INTRAVENOUS | Status: AC
  Administered 2017-12-20: 1000 mg via INTRAVENOUS
  Filled 2017-12-20: qty 1000

## 2017-12-20 MED ORDER — SOD CITRATE-CITRIC ACID 500-334 MG/5ML PO SOLN
30.0000 mL | ORAL | Status: DC | PRN
  Administered 2017-12-20: 30 mL via ORAL
  Filled 2017-12-20: qty 15

## 2017-12-20 MED ORDER — LACTATED RINGERS IV BOLUS (SEPSIS): 1000.0000 mL | Freq: Once | INTRAVENOUS | Status: DC

## 2017-12-20 MED ORDER — OXYTOCIN 40 UNITS IN LACTATED RINGERS INFUSION - SIMPLE MED: 2.5000 [IU]/h | INTRAVENOUS | Status: AC

## 2017-12-20 MED ORDER — FERROUS SULFATE 325 (65 FE) MG PO TABS
325.0000 mg | ORAL_TABLET | Freq: Every day | ORAL | Status: DC
  Administered 2017-12-22: 325 mg via ORAL
  Filled 2017-12-20: qty 1

## 2017-12-20 MED ORDER — WITCH HAZEL-GLYCERIN EX PADS: 1.0000 "application " | MEDICATED_PAD | CUTANEOUS | Status: DC | PRN

## 2017-12-20 MED ORDER — SODIUM CHLORIDE 0.9% FLUSH: 3.0000 mL | INTRAVENOUS | Status: DC | PRN

## 2017-12-20 MED ORDER — KETOROLAC TROMETHAMINE 30 MG/ML IJ SOLN
30.0000 mg | Freq: Four times a day (QID) | INTRAMUSCULAR | Status: AC | PRN
  Administered 2017-12-21: 30 mg via INTRAVENOUS
  Filled 2017-12-20: qty 1

## 2017-12-20 MED ORDER — OXYCODONE-ACETAMINOPHEN 5-325 MG PO TABS: 1.0000 | ORAL_TABLET | ORAL | Status: DC | PRN

## 2017-12-20 MED ORDER — ONDANSETRON HCL 4 MG/2ML IJ SOLN
4.0000 mg | Freq: Four times a day (QID) | INTRAMUSCULAR | Status: DC | PRN
  Administered 2017-12-20: 4 mg via INTRAVENOUS

## 2017-12-20 MED ORDER — NALBUPHINE HCL 10 MG/ML IJ SOLN: 5.0000 mg | Freq: Once | INTRAMUSCULAR | Status: DC | PRN

## 2017-12-20 MED ORDER — DIBUCAINE 1 % RE OINT: 1.0000 "application " | TOPICAL_OINTMENT | RECTAL | Status: DC | PRN

## 2017-12-20 MED ORDER — MEPERIDINE HCL 25 MG/ML IJ SOLN
INTRAMUSCULAR | Status: DC | PRN
Start: 2017-12-20 — End: 2017-12-20
  Administered 2017-12-20: 12.5 mg via INTRAVENOUS

## 2017-12-20 MED ORDER — LACTATED RINGERS IV BOLUS (SEPSIS)
500.0000 mL | Freq: Once | INTRAVENOUS | Status: AC
  Administered 2017-12-20: 500 mL via INTRAVENOUS

## 2017-12-20 MED ORDER — OXYCODONE HCL 5 MG PO TABS: 5.0000 mg | ORAL_TABLET | ORAL | Status: DC | PRN

## 2017-12-20 MED ORDER — OXYTOCIN 40 UNITS IN LACTATED RINGERS INFUSION - SIMPLE MED: 2.5000 [IU]/h | INTRAVENOUS | Status: DC

## 2017-12-20 MED ORDER — MEPERIDINE HCL 25 MG/ML IJ SOLN
INTRAMUSCULAR | Status: AC
  Filled 2017-12-20: qty 1

## 2017-12-20 MED ORDER — PHENYLEPHRINE 40 MCG/ML (10ML) SYRINGE FOR IV PUSH (FOR BLOOD PRESSURE SUPPORT)
PREFILLED_SYRINGE | INTRAVENOUS | Status: AC
  Filled 2017-12-20: qty 10

## 2017-12-20 MED ORDER — EPHEDRINE 5 MG/ML INJ: 10.0000 mg | INTRAVENOUS | Status: DC | PRN

## 2017-12-20 MED ORDER — OXYTOCIN BOLUS FROM INFUSION: 500.0000 mL | Freq: Once | INTRAVENOUS | Status: DC

## 2017-12-20 MED ORDER — FENTANYL 2.5 MCG/ML BUPIVACAINE 1/10 % EPIDURAL INFUSION (WH - ANES)
14.0000 mL/h | INTRAMUSCULAR | Status: DC | PRN
  Administered 2017-12-20 (×2): 14 mL/h via EPIDURAL
  Filled 2017-12-20 (×2): qty 100

## 2017-12-20 MED ORDER — ZOLPIDEM TARTRATE 5 MG PO TABS: 5.0000 mg | ORAL_TABLET | Freq: Every evening | ORAL | Status: DC | PRN

## 2017-12-20 MED ORDER — NALOXONE HCL 0.4 MG/ML IJ SOLN: 0.4000 mg | INTRAMUSCULAR | Status: DC | PRN

## 2017-12-20 MED ORDER — NALOXONE HCL 0.4 MG/ML IJ SOLN: 1.0000 ug/kg/h | INTRAVENOUS | Status: DC | PRN

## 2017-12-20 MED ORDER — SOD CITRATE-CITRIC ACID 500-334 MG/5ML PO SOLN: 30.0000 mL | ORAL | Status: DC

## 2017-12-20 MED ORDER — LACTATED RINGERS IV SOLN
INTRAVENOUS | Status: DC
  Administered 2017-12-20 (×2): via INTRAUTERINE

## 2017-12-20 MED ORDER — LIDOCAINE HCL (PF) 1 % IJ SOLN
INTRAMUSCULAR | Status: DC | PRN
  Administered 2017-12-20 (×2): 5 mL

## 2017-12-20 MED ORDER — NALBUPHINE HCL 10 MG/ML IJ SOLN: 5.0000 mg | INTRAMUSCULAR | Status: DC | PRN

## 2017-12-20 MED ORDER — PENICILLIN G POT IN DEXTROSE 60000 UNIT/ML IV SOLN
3.0000 10*6.[IU] | INTRAVENOUS | Status: DC
  Administered 2017-12-20 (×3): 3 10*6.[IU] via INTRAVENOUS
  Filled 2017-12-20 (×6): qty 50

## 2017-12-20 MED ORDER — LACTATED RINGERS IV SOLN
INTRAVENOUS | Status: DC
  Administered 2017-12-21: 03:00:00 via INTRAVENOUS

## 2017-12-20 MED ORDER — PHENYLEPHRINE 40 MCG/ML (10ML) SYRINGE FOR IV PUSH (FOR BLOOD PRESSURE SUPPORT)
80.0000 ug | PREFILLED_SYRINGE | INTRAVENOUS | Status: DC | PRN
  Filled 2017-12-20: qty 10

## 2017-12-20 MED ORDER — FENTANYL CITRATE (PF) 100 MCG/2ML IJ SOLN
INTRAMUSCULAR | Status: AC
  Filled 2017-12-20: qty 2

## 2017-12-20 MED ORDER — DIPHENHYDRAMINE HCL 25 MG PO CAPS: 25.0000 mg | ORAL_CAPSULE | Freq: Four times a day (QID) | ORAL | Status: DC | PRN

## 2017-12-20 MED ORDER — TETANUS-DIPHTH-ACELL PERTUSSIS 5-2.5-18.5 LF-MCG/0.5 IM SUSP: 0.5000 mL | Freq: Once | INTRAMUSCULAR | Status: DC

## 2017-12-20 MED ORDER — FENTANYL CITRATE (PF) 100 MCG/2ML IJ SOLN: 25.0000 ug | INTRAMUSCULAR | Status: DC | PRN

## 2017-12-20 MED ORDER — DEXAMETHASONE SODIUM PHOSPHATE 10 MG/ML IJ SOLN
INTRAMUSCULAR | Status: DC | PRN
  Administered 2017-12-20: 10 mg via INTRAVENOUS

## 2017-12-20 MED ORDER — OXYCODONE-ACETAMINOPHEN 5-325 MG PO TABS: 2.0000 | ORAL_TABLET | ORAL | Status: DC | PRN

## 2017-12-20 MED ORDER — IBUPROFEN 600 MG PO TABS
600.0000 mg | ORAL_TABLET | Freq: Four times a day (QID) | ORAL | Status: DC
  Administered 2017-12-21 – 2017-12-22 (×6): 600 mg via ORAL
  Filled 2017-12-20 (×6): qty 1

## 2017-12-20 MED ORDER — FLEET ENEMA 7-19 GM/118ML RE ENEM: 1.0000 | ENEMA | RECTAL | Status: DC | PRN

## 2017-12-20 MED ORDER — ACETAMINOPHEN 325 MG PO TABS: 650.0000 mg | ORAL_TABLET | ORAL | Status: DC | PRN

## 2017-12-20 MED ORDER — MENTHOL 3 MG MT LOZG: 1.0000 | LOZENGE | OROMUCOSAL | Status: DC | PRN

## 2017-12-20 MED ORDER — PROMETHAZINE HCL 25 MG/ML IJ SOLN: 6.2500 mg | INTRAMUSCULAR | Status: DC | PRN

## 2017-12-20 MED ORDER — LACTATED RINGERS IV SOLN
INTRAVENOUS | Status: DC | PRN
Start: 2017-12-20 — End: 2017-12-20
  Administered 2017-12-20 (×2): via INTRAVENOUS

## 2017-12-20 MED ORDER — DIPHENHYDRAMINE HCL 25 MG PO CAPS: 25.0000 mg | ORAL_CAPSULE | ORAL | Status: DC | PRN

## 2017-12-20 MED ORDER — KETOROLAC TROMETHAMINE 30 MG/ML IJ SOLN
30.0000 mg | Freq: Four times a day (QID) | INTRAMUSCULAR | Status: AC | PRN
  Administered 2017-12-20: 30 mg via INTRAMUSCULAR

## 2017-12-20 SURGICAL SUPPLY — 39 items
BENZOIN TINCTURE PRP APPL 2/3 (GAUZE/BANDAGES/DRESSINGS) ×3 IMPLANT
CANISTER SUCT 3000ML PPV (MISCELLANEOUS) ×3 IMPLANT
CHLORAPREP W/TINT 26ML (MISCELLANEOUS) ×3 IMPLANT
CLOSURE STERI STRIP 1/2 X4 (GAUZE/BANDAGES/DRESSINGS) ×2 IMPLANT
CLOSURE WOUND 1/2 X4 (GAUZE/BANDAGES/DRESSINGS) ×1
DERMABOND ADVANCED (GAUZE/BANDAGES/DRESSINGS) ×2
DERMABOND ADVANCED .7 DNX12 (GAUZE/BANDAGES/DRESSINGS) ×1 IMPLANT
DRSG OPSITE POSTOP 4X10 (GAUZE/BANDAGES/DRESSINGS) ×3 IMPLANT
ELECT REM PT RETURN 9FT ADLT (ELECTROSURGICAL) ×3
ELECTRODE REM PT RTRN 9FT ADLT (ELECTROSURGICAL) ×1 IMPLANT
EXTRACTOR VACUUM KIWI (MISCELLANEOUS) ×3 IMPLANT
GLOVE BIOGEL PI IND STRL 7.0 (GLOVE) ×2 IMPLANT
GLOVE BIOGEL PI IND STRL 7.5 (GLOVE) ×1 IMPLANT
GLOVE BIOGEL PI INDICATOR 7.0 (GLOVE) ×4
GLOVE BIOGEL PI INDICATOR 7.5 (GLOVE) ×2
GLOVE SKINSENSE NS SZ7.0 (GLOVE) ×2
GLOVE SKINSENSE STRL SZ7.0 (GLOVE) ×1 IMPLANT
GOWN STRL REUS W/ TWL LRG LVL3 (GOWN DISPOSABLE) ×2 IMPLANT
GOWN STRL REUS W/ TWL XL LVL3 (GOWN DISPOSABLE) ×1 IMPLANT
GOWN STRL REUS W/TWL LRG LVL3 (GOWN DISPOSABLE) ×4
GOWN STRL REUS W/TWL XL LVL3 (GOWN DISPOSABLE) ×2
HOVERMATT SINGLE USE (MISCELLANEOUS) ×3 IMPLANT
NS IRRIG 1000ML POUR BTL (IV SOLUTION) ×3 IMPLANT
PACK C SECTION WH (CUSTOM PROCEDURE TRAY) ×3 IMPLANT
PAD ABD 7.5X8 STRL (GAUZE/BANDAGES/DRESSINGS) ×3 IMPLANT
PAD OB MATERNITY 4.3X12.25 (PERSONAL CARE ITEMS) ×3 IMPLANT
PAD PREP 24X48 CUFFED NSTRL (MISCELLANEOUS) ×3 IMPLANT
PENCIL SMOKE EVAC W/HOLSTER (ELECTROSURGICAL) ×3 IMPLANT
SPONGE LAP 18X18 X RAY DECT (DISPOSABLE) ×3 IMPLANT
STRIP CLOSURE SKIN 1/2X4 (GAUZE/BANDAGES/DRESSINGS) ×2 IMPLANT
SUT MNCRL 0 VIOLET CTX 36 (SUTURE) ×3 IMPLANT
SUT MON AB 4-0 PS1 27 (SUTURE) ×3 IMPLANT
SUT MONOCRYL 0 CTX 36 (SUTURE) ×6
SUT PLAIN 2 0 XLH (SUTURE) ×3 IMPLANT
SUT VIC AB 0 CT1 36 (SUTURE) ×6 IMPLANT
SUT VIC AB 3-0 CT1 27 (SUTURE) ×2
SUT VIC AB 3-0 CT1 TAPERPNT 27 (SUTURE) ×1 IMPLANT
SUT VIC AB 4-0 KS 27 (SUTURE) ×3 IMPLANT
TOWEL OR 17X24 6PK STRL BLUE (TOWEL DISPOSABLE) ×6 IMPLANT

## 2017-12-20 NOTE — Progress Notes (Addendum)
Labor Progress Note  Marcellus ScottGina L Keeble is a 42 y.o. [phone] at 8193w0d  admitted for rupture of membranes(PROM).  S: Patitnet without concerns. Comfortable.   Called to room after epidural due to NRFHT with recurrent decels.   O:  BP 118/69   Pulse 98   Temp 98 F (36.7 C) (Oral)   Resp 15   Ht 5\' 2"  (1.575 m)   Wt 106.1 kg (234 lb)   SpO2 96%   BMI 42.80 kg/m   Total I/O In: -  Out: 550 [Urine:550]  FHT:  FHR: 135 bpm, variability: moderate,  accelerations:  Present,  decelerations:  Present variable UC:   regular, every 2-4 minutes SVE:   Dilation: 2 Effacement (%): 60 Station: -3 Exam by:: Doroteo GlassmanPhelps MD SROM @2100    Labs: Lab Results  Component Value Date   WBC 11.8 (H) 12/20/2017   HGB 12.4 12/20/2017   HCT 36.8 12/20/2017   MCV 90.2 12/20/2017   PLT 257 12/20/2017    Assessment / Plan: 42 y.o. G4P3003 2293w0d in early labor Spontaneous labor, progressing normally  Labor: Early labor. PROM. Pitocin was started for augmentation but discontinued. Will monitor until [facility] become more reassuring. Internals placed. Fetal Wellbeing:  Category II, stop pitocin, give fluid bolus. Amnioinfusion started. Terbutaline given.  Pain Control:  Labor support without medications Anticipated MOD:  NSVD  Expectant management   Caryl AdaJazma Hermann Dottavio, DO OB Fellow

## 2017-12-20 NOTE — Progress Notes (Signed)
L&D Note  12/20/2017 - 1:30 PM  42 y.o. [id number] 847w0d. Pregnancy complicated by BMI 40s, GBS pos, AMA   Denise Andrade is admitted for SROM at approx 2030 on 1/6   Subjective:  Still comfortable with epidural. In LR position with O2 on.   Objective:    Current Vital Signs 24h Vital Sign Ranges  T 98.5 F (36.9 C) Temp  Avg: 98.2 F (36.8 C)  Min: 97.8 F (36.6 C)  Max: 98.8 F (37.1 C)  BP 120/64 BP  Min: 94/58  Max: 135/81  HR 90 Pulse  Avg: 87.8  Min: 77  Max: 99  RR 18 Resp  Avg: 16  Min: 14  Max: 20  SaO2 100 % Not Delivered SpO2  Avg: 98.1 %  Min: 96 %  Max: 100 %       24 Hour I/O Current Shift I/O  Time Ins Outs 01/06 0701 - 01/07 0700 In: -  Out: 550 [Urine:550] 01/07 0701 - 01/07 1900 In: -  Out: 1050 [Urine:800]   FHR: 140 baseline, ?occasional accels, subtle lates with some ?subtle early decels, mod variabilit Toco: q1-7040m Gen: NAD SVE: 7/90/-3 (no change from prior exam approx 620m ago)  Labs:  Recent Labs  Lab 12/14/17 1830 12/20/17 0134  WBC 10.2 11.8*  HGB 12.4 12.4  HCT 37.3 36.8  PLT 261 257   Recent Labs  Lab 12/14/17 1830  NA 136  K 3.7  CL 106  CO2 19*  BUN 11  CREATININE 0.74  CALCIUM 8.7*  PROT 6.9  BILITOT 0.4  ALKPHOS 236*  ALT 28  AST 21  GLUCOSE 86    Medications Current Facility-Administered Medications  Medication Dose Route Frequency Provider Last Rate Last Dose  . acetaminophen (TYLENOL) tablet 650 mg  650 mg Oral Q4H PRN Caryl AdaPhelps, Jazma Y, DO      . diphenhydrAMINE (BENADRYL) injection 12.5 mg  12.5 mg Intravenous Q15 min PRN Phillips Groutarignan, Peter, MD      . ePHEDrine injection 10 mg  10 mg Intravenous PRN Phillips Groutarignan, Peter, MD      . ePHEDrine injection 10 mg  10 mg Intravenous PRN Phillips Groutarignan, Peter, MD      . fentaNYL 2.5 mcg/ml w/bupivacaine 0.1% in NS 100ml epidural infusion (WH-ANES)  14 mL/hr Epidural Continuous PRN Phillips Groutarignan, Peter, MD 14 mL/hr at 12/20/17 1225 14 mL/hr at 12/20/17 1225  . lactated ringers bolus 1,000  mL  1,000 mL Intravenous Once [place] BingPickens, Tyrice Hewitt, MD      . lactated ringers bolus 500 mL  500 mL Intravenous Once [place] BingPickens, Rajni Holsworth, MD 500 mL/hr at 12/20/17 1312 500 mL at 12/20/17 1312  . lactated ringers infusion 500-1,000 mL  500-1,000 mL Intravenous PRN Caryl AdaPhelps, Jazma Y, DO 1,000 mL/hr at 12/20/17 0914 1,000 mL at 12/20/17 0914  . lactated ringers infusion   Intravenous Continuous Pincus Largehelps, Jazma Y, DO 125 mL/hr at 12/20/17 1107    . lactated ringers infusion   Intrauterine Continuous Pincus Largehelps, Jazma Y, DO 150 mL/hr at 12/20/17 1007    . lidocaine (PF) (XYLOCAINE) 1 % injection 30 mL  30 mL Subcutaneous PRN Caryl AdaPhelps, Jazma Y, DO      . ondansetron [facility](ZOFRAN) injection 4 mg  4 mg Intravenous Q6H PRN Caryl AdaPhelps, Jazma Y, DO      . oxyCODONE-acetaminophen (PERCOCET/ROXICET) 5-325 MG per tablet 1 tablet  1 tablet Oral Q4H PRN Caryl AdaPhelps, Jazma Y, DO      . oxyCODONE-acetaminophen (PERCOCET/ROXICET) 5-325 MG per tablet 2 tablet  2 tablet Oral Q4H PRN Pincus Large, DO      . oxytocin (PITOCIN) IV BOLUS FROM BAG  500 mL Intravenous Once Doroteo Glassman, Jazma Y, DO      . oxytocin (PITOCIN) IV infusion 40 units in LR 1000 mL - Premix  2.5 Units/hr Intravenous Continuous Caryl Ada Y, DO      . oxytocin (PITOCIN) IV infusion 40 units in LR 1000 mL - Premix  1-40 milli-units/min Intravenous Titrated Freddrick March, MD   Stopped at 12/20/17 1324  . penicillin G potassium 3 Million Units in dextrose 50mL IVPB  3 Million Units Intravenous Q4H Pincus Large, DO 100 mL/hr at 12/20/17 1005 3 Million Units at 12/20/17 1005  . PHENYLephrine 40 mcg/ml in normal saline Adult IV Push Syringe  80 mcg Intravenous PRN Phillips Grout, MD      . PHENYLephrine 40 mcg/ml in normal saline Adult IV Push Syringe  80 mcg Intravenous PRN Phillips Grout, MD      . sodium citrate-citric acid (ORACIT) solution 30 mL  30 mL Oral Q2H PRN Pincus Large, DO      . sodium phosphate (FLEET) 7-19 GM/118ML enema 1 enema  1 enema Rectal PRN Pincus Large, DO       Facility-Administered Medications Ordered in Other Encounters  Medication Dose Route Frequency Provider Last Rate Last Dose  . lidocaine (PF) (XYLOCAINE) 1 % injection    Anesthesia Intra-op Phillips Grout, MD   5 mL at 12/20/17 0411    Assessment & Plan:  Pt stable *IUP: category II but with normal baseline and moderate variability; pt had decel with moving to on her back with SVE but currently back up. Pitocin already off and IVF bolusing currently with FSE and AI going. D/w her recommend repeat exam in 21m and if not making cx progress on her own, would likely recommend c-section given inability to augment with pitocin and not making cx change on her own.  *IOL: see above. Pelvis feels adequate. efw 3400gm.  *GBS: pos. S/p pcn x 2 already *Analgesia: comfortable  Cornelia Copa MD Attending Center for [facility])

## 2017-12-20 NOTE — Addendum Note (Signed)
Addendum  created 12/20/17 2001 by Elgie CongoMalinova, Eschol Auxier H, CRNA   Sign clinical note

## 2017-12-20 NOTE — Op Note (Signed)
Operative Note   SURGERY DATE: 12/20/2017  PRE-OP DIAGNOSIS:  *Pregnancy at 40/0 *Arrest of dilation at 6-7cm dilation *Fetal intolerance of pitocin augmentation *Advanced maternal age *BMI 40s  POST-OP DIAGNOSIS: Same. Moderate meconium   PROCEDURE: primary low transverse cesarean section via pfannenstiel skin incision with double layer uterine closure  SURGEON: Surgeon(s) and Role:    * Patagonia BingPickens, Janequa Kipnis, MD - Primary  ASSISTANT:    * Degele, Kandra NicolasJulie P, MD - Fellow  ANESTHESIA: epidural  ESTIMATED BLOOD LOSS: 860mL  DRAINS: 200mL UOP via indwelling foley  TOTAL IV FLUIDS: 2600mL crystalloid  VTE PROPHYLAXIS: SCDs to bilateral lower extremities  ANTIBIOTICS: Two grams of Cefazolin were given., within 1 hour of skin incision and azithromycin 500mg  IV x 1 given intra operatively  SPECIMENS: placenta to path  COMPLICATIONS: None  FINDINGS: No intra-abdominal adhesions were noted. Grossly normal uterus, tubes and ovaries. Moderate meconium stained amniotic fluid, cephalic with moderate caput female infant, weight 3045gm, APGARs 8/9, intact placenta.  PROCEDURE IN DETAIL: The patient was taken to the operating room where anesthesia was administered and normal fetal heart tones were confirmed. She was then prepped and draped in the normal fashion in the dorsal supine position with a leftward tilt.  After a time out was performed, a pfannensteil skin incision was made with the scalpel and carried through to the underlying layer of fascia. The fascia was then incised at the midline and this incision was extended laterally with the mayo scissors. Attention was turned to the superior aspect of the fascial incision which was grasped with the kocher clamps x 2, tented up and the rectus muscles were dissected off with the bovie. In a similar fashion the inferior aspect of the fascial incision was grasped with the kocher clamps, tented up and the rectus muscles dissected off with the mayo scissors.  The rectus muscles were then separated in the midline and the peritoneum was entered bluntly. The bladder blade was inserted and the vesicouterine peritoneum was identified, tented up and entered with the metzenbaum scissors. This incision was extended laterally and the bladder flap was created digitally. The bladder blade was reinserted.  A low transverse hysterotomy was made with the scalpel until the endometrial cavity was breached, yielding meconium stained amniotic fluid. This incision was extended bluntly and the infant's head, shoulders and body were delivered atraumatically.The cord was clamped x 2 and cut, and the infant was handed to the awaiting pediatricians, after delayed cord clamping was done.  The placenta was then gradually expressed from the uterus and then the uterus was exteriorized and cleared of all clots and debris. The hysterotomy was repaired with a running suture of 1-0 monocryl. A second imbricating layer of 1-0 monocryl suture was then placed. Several figure-of-eight sutures of monocryl were added to achieve excellent hemostasis.   The uterus and adnexa were then returned to the abdomen, and the hysterotomy and all operative sites were reinspected and excellent hemostasis was noted after irrigation and suction of the abdomen with warm saline.  The peritoneum was closed with a running stitch of 3-0 Vicryl. The fascia was reapproximated with 0 Vicryl in a simple running fashion bilaterally. The subcutaneous layer was then reapproximated with interrupted sutures of 2-0 plain gut, and the skin was then closed with 4-0 monocryl, in a subcuticular fashion.  The patient  tolerated the procedure well. Sponge, lap, needle, and instrument counts were correct x 2. The patient was transferred to the recovery room awake, alert and breathing independently in  stable condition.  Durene Romans MD Attending Center for [facility])

## 2017-12-20 NOTE — Transfer of Care (Signed)
Immediate Anesthesia Transfer of Care Note  Patient: Denise Andrade  Procedure(s) Performed: CESAREAN SECTION (N/A Abdomen)  Patient Location: PACU  Anesthesia Type:Epidural  Level of Consciousness: awake, alert , oriented and patient cooperative  Airway & Oxygen Therapy: Patient Spontanous Breathing  Post-op Assessment: Report given to RN and Post -op Vital signs reviewed and stable  Post vital signs: Reviewed and stable  Last Vitals:  Vitals:   12/20/17 1442 12/20/17 1445  BP: (!) 146/80 (!) 154/90  Pulse: 97 99  Resp:    Temp:    SpO2:      Last Pain:  Vitals:   12/20/17 1400  TempSrc: Oral  PainSc: 0-No pain      Patients Stated Pain Goal: 3 (12/20/17 0159)  Complications: No apparent anesthesia complications

## 2017-12-20 NOTE — Anesthesia Postprocedure Evaluation (Signed)
Anesthesia Post Note  Patient: Marcellus ScottGina L Schlee  Procedure(s) Performed: CESAREAN SECTION (N/A Abdomen)     Patient location during evaluation: PACU Anesthesia Type: Epidural Level of consciousness: awake, awake and alert and oriented Pain management: pain level controlled Vital Signs Assessment: post-procedure vital signs reviewed and stable Respiratory status: spontaneous breathing, nonlabored ventilation and respiratory function stable Cardiovascular status: stable Postop Assessment: no headache, no backache, epidural receding, patient able to bend at knees, no signs of nausea or vomiting, no apparent nausea or vomiting and adequate PO intake Anesthetic complications: no    Last Vitals:  Vitals:   12/20/17 1700 12/20/17 1715  BP: 121/80 119/82  Pulse: 88 88  Resp: 15 17  Temp:    SpO2: 97% 98%    Last Pain:  Vitals:   12/20/17 1715  TempSrc:   PainSc: 0-No pain   Pain Goal: Patients Stated Pain Goal: 3 (12/20/17 0159)               Cecile HearingStephen Edward Derik Fults

## 2017-12-20 NOTE — MAU Note (Signed)
Pt here with c/o contractions since about 8pm. Having some bloody show and some leaking of fluid. Reports fetal movement.

## 2017-12-20 NOTE — Anesthesia Postprocedure Evaluation (Signed)
Anesthesia Post Note  Patient: Denise Andrade  Procedure(s) Performed: CESAREAN SECTION (N/A Abdomen)     Patient location during evaluation: Mother Baby Anesthesia Type: Epidural Level of consciousness: awake and alert Pain management: pain level controlled Vital Signs Assessment: post-procedure vital signs reviewed and stable Respiratory status: spontaneous breathing, nonlabored ventilation and respiratory function stable Cardiovascular status: stable Postop Assessment: no headache, no backache, epidural receding, patient able to bend at knees, adequate PO intake and no apparent nausea or vomiting Anesthetic complications: no    Last Vitals:  Vitals:   12/20/17 1835 12/20/17 1917  BP: 113/61 116/70  Pulse: 80 71  Resp: 20 18  Temp: 36.8 C   SpO2: 97% 98%    Last Pain:  Vitals:   12/20/17 1920  TempSrc:   PainSc: 0-No pain   Pain Goal: Patients Stated Pain Goal: 3 (12/20/17 0159)               Laban EmperorMalinova,Denise Andrade

## 2017-12-20 NOTE — Progress Notes (Signed)
Labor Progress Note Marcellus ScottGina L Quiles is a 42 y.o. G4P3003 at 6537w0d c/b BMI >40, GBS+ presented for  SROM at approx 2030 on 1/6   S: No complaints, comfortable after epidural placement.   O:  BP (!) 103/57   Pulse 99   Temp 98.5 F (36.9 C) (Oral)   Resp 20   Ht 5\' 2"  (1.575 m)   Wt 106.1 kg (234 lb)   SpO2 100%   BMI 42.80 kg/m  EFM: 140/ mo variability/+ accels, occasional late decels/ + scalp stim Toco: Q267min Gen: NAD MSK: trace edema  CVE: Dilation: 6.5 Effacement (%): 90(thick anterior lip) Cervical Position: Middle Station: -3 Presentation: Vertex Exam by:: Dr. Nira Retortegele   A&P: 42 y.o. G4P3003 7337w0d SROM at approx 2030 on 1/6 #Labor: Continue to monitor closely, on pitocin gtt, FSE in place. EFW 3400 #Pain: controlled #FWB: Category II #GBS positive. S/p PCN x2   Alroy BailiffParker W Dmitry Macomber, MD PGY-1 12:26 PM

## 2017-12-20 NOTE — H&P (Signed)
Denise ScottGina L Andrade is a 42 y.o. [phone]  female presenting for SROM - she reports she felt some leaking and contractions earlier at home and came in to be checked.  In MAU, she had a gush of fluid around 8:30 PM after using the bathroom. Reports good fetal movement.  Denies vaginal bleeding, pain.  Contractions are regular. Without issues this pregnancy.   [facility]  OB History    Gravida Para Term Preterm AB Living   4 3 3     3    SAB TAB Ectopic Multiple Live Births                 Past Medical History:  Diagnosis Date  . Medical history non-contributory    Past Surgical History:  Procedure Laterality Date  . NO PAST SURGERIES     Family History: family history is not on file. Social History:  reports that she has quit smoking. she has never used smokeless tobacco. She reports that she does not drink alcohol or use drugs.    Maternal Diabetes: No Genetic Screening: Normal Maternal Ultrasounds/Referrals: Normal Fetal Ultrasounds or other Referrals:  None Maternal Substance Abuse:  No Significant Maternal Medications:  None Significant Maternal Lab Results:  None Other Comments:  None  Review of Systems  Eyes: Negative for blurred vision.  Respiratory: Negative for shortness of breath.   Cardiovascular: Negative for leg swelling.  Skin: Negative for rash.   History  Dilation: 1.5 Effacement (%): 60 Station: -3 Exam by:: Denise NeighborsErin MacDonald RN  Blood pressure 123/82, pulse 88, temperature 98.3 F (36.8 C), resp. rate 16, height 5\' 2"  (1.575 m), weight 106.1 kg (234 lb), SpO2 99 %.  Physical Exam  Constitutional: She is oriented to person, place, and time. She appears well-developed and well-nourished. No distress.  HENT:  Head: Normocephalic and atraumatic.  Neck: Neck supple.  GI:  gravid  Neurological: She is alert and oriented to person, place, and time.  Skin: Skin is warm and dry.    Prenatal labs: ABO, Rh: --/--/O POS (01/07  0134)  Antibody: NEG (01/07 0134) NEG Rubella: Immune (01/07 0000) RPR: Nonreactive (01/07 0000)  HBsAg: Negative (01/07 0000)  HIV: Non-reactive (01/07 0000)  GBS: Positive (01/07 0000)  Assessment/Plan: Denise SavageGina Andrade is a 42 y.o. G4P3003 at 2685w1d here with SROM and contractions.   Plan to admit to L+D floor. Will treat with PCN as she is GBS positive and no history of allergy.   #Labor: Expectant management at this time.  Will start Pitocin to augment labor.  #Pain:  Epidural #FWB: Category I #ID:      GBS pos, penicillin #MOF: both #MOC: Mirena  #Circ:   yes, inpatient  Denise MarchYashika Amin, MD  12/20/2017, 2:56 AM  OB FELLOW HISTORY AND PHYSICAL ATTESTATION  I confirm that I have verified the information documented in the resident's note and that I have also personally reperformed the physical exam and all medical decision making activities. I agree with above documentation and have made edits as needed.   Denise AdaJazma Jeylin Woodmansee, DO OB Fellow

## 2017-12-20 NOTE — Progress Notes (Signed)
L&D Note SVE unchanged. Category I tracing. Toco q1-8971m. D/w her that I recommend c-section given inability to augment with pitocin. D/w her that could try and augment one more time and if doesn't tolerate then can proceed with c-section but she is amenable for c-section. Will do azithro and ancef.   Denise Andrade, Jr MD Attending Center for [facility] (Faculty Practice) 12/20/2017 Time: [id number]

## 2017-12-20 NOTE — Anesthesia Pain Management Evaluation Note (Signed)
  CRNA Pain Management Visit Note  Patient: Denise Andrade, 42 y.o., female  "Hello I am a member of the anesthesia team at [facility]. We have an anesthesia team available at all times to provide care throughout the hospital, including epidural management and anesthesia for C-section. I don't know your plan for the delivery whether it a natural birth, water birth, IV sedation, nitrous supplementation, doula or epidural, but we want to meet your pain goals."   1.Was your pain managed to your expectations on prior hospitalizations?   Yes   2.What is your expectation for pain management during this hospitalization?     Epidural  3.How can we help you reach that goal? epidural  Record the patient's initial score and the patient's pain goal.   Pain: 0  Pain Goal: 4 [facility] wants you to be able to say your pain was always managed very well.  Denise Andrade 12/20/2017

## 2017-12-20 NOTE — Anesthesia Preprocedure Evaluation (Addendum)
Anesthesia Evaluation  Patient identified by MRN, date of birth, ID band Patient awake    Reviewed: Allergy & Precautions, H&P , NPO status , Patient's Chart, lab work & pertinent test results  History of Anesthesia Complications Negative for: history of anesthetic complications  Airway Mallampati: II  TM Distance: >3 FB Neck ROM: full    Dental no notable dental hx. (+) Teeth Intact   Pulmonary neg pulmonary ROS, former smoker,    Pulmonary exam normal breath sounds clear to auscultation       Cardiovascular negative cardio ROS Normal cardiovascular exam Rhythm:regular Rate:Normal     Neuro/Psych negative neurological ROS  negative psych ROS   GI/Hepatic negative GI ROS, Neg liver ROS,   Endo/Other  Morbid obesity  Renal/GU negative Renal ROS  negative genitourinary   Musculoskeletal   Abdominal   Peds  Hematology negative hematology ROS (+)   Anesthesia Other Findings   Reproductive/Obstetrics (+) Pregnancy                             Anesthesia Physical Anesthesia Plan  ASA: III and emergent  Anesthesia Plan: Epidural   Post-op Pain Management:    Induction:   PONV Risk Score and Plan:   Airway Management Planned:   Additional Equipment:   Intra-op Plan:   Post-operative Plan:   Informed Consent: I have reviewed the patients History and Physical, chart, labs and discussed the procedure including the risks, benefits and alternatives for the proposed anesthesia with the patient or authorized representative who has indicated his/her understanding and acceptance.     Plan Discussed with:   Anesthesia Plan Comments: (Epidural to C-section for fetal intolerance of labor.)       Anesthesia Quick Evaluation

## 2017-12-20 NOTE — Progress Notes (Signed)
L&D Note  12/20/2017 - 9:22 AM  41 y.o. [id number] [redacted]w[redacted]d. Pregnancy complicated by BMI 40s, GBS pos, AMA   [name] is admitted for SROM at approx 2030 on 1/6   Subjective:  Feeling comfortable s/p epidural placement   Objective:    Current Vital Signs 24h Vital Sign Ranges  T 98.1 F (36.7 C) Temp  Avg: 98.1 F (36.7 C)  Min: 97.8 F (36.6 C)  Max: 98.8 F (37.1 C)  BP 116/62 BP  Min: 94/58  Max: 135/81  HR 86 Pulse  Avg: 86.3  Min: 77  Max: 98  RR 16 Resp  Avg: 15.4  Min: 14  Max: 18  SaO2 100 % Not Delivered SpO2  Avg: 98.1 %  Min: 96 %  Max: 100 %       24 Hour I/O Current Shift I/O  Time Ins Outs 01/06 0701 - 01/07 0700 In: -  Out: 550 [Urine:550] No intake/output data recorded.   FHR: 130 baseline, no accels, subtle recurrent lates, mod variability; during SVE +scalp stim Toco: q50m Gen: NAD SVE: 4-5/50/high, presenting edge 0-->membranes stripped  Labs:  Recent Labs  Lab 12/14/17 1830 12/20/17 0134  WBC 10.2 11.8*  HGB 12.4 12.4  HCT 37.3 36.8  PLT 261 257   Recent Labs  Lab 12/14/17 1830  NA 136  K 3.7  CL 106  CO2 19*  BUN 11  CREATININE 0.74  CALCIUM 8.7*  PROT 6.9  BILITOT 0.4  ALKPHOS 236*  ALT 28  AST 21  GLUCOSE 86    Medications Current Facility-Administered Medications  Medication Dose Route Frequency Provider Last Rate Last Dose  . acetaminophen (TYLENOL) tablet 650 mg  650 mg Oral Q4H PRN Caryl Ada Y, DO      . diphenhydrAMINE (BENADRYL) injection 12.5 mg  12.5 mg Intravenous Q15 min PRN Phillips Grout, MD      . ePHEDrine injection 10 mg  10 mg Intravenous PRN Phillips Grout, MD      . ePHEDrine injection 10 mg  10 mg Intravenous PRN Phillips Grout, MD      . fentaNYL 2.5 mcg/ml w/bupivacaine 0.1% in NS epidural infusion (WH-ANES)  14 mL/hr Epidural Continuous PRN Phillips Grout, MD 14 mL/hr at 12/20/17 0411 14 mL/hr at 12/20/17 0411  . lactated ringers infusion 500-1,000 mL  500-1,000 mL Intravenous PRN  Caryl Ada Y, DO 1,000 mL/hr at 12/20/17 0914 1,000 mL at 12/20/17 0914  . lactated ringers infusion   Intravenous Continuous Pincus Large, DO 125 mL/hr at 12/20/17 0138    . lactated ringers infusion   Intrauterine Continuous Pincus Large, DO 150 mL/hr at 12/20/17 0524    . lidocaine (PF) (XYLOCAINE) 1 % injection 30 mL  30 mL Subcutaneous PRN Caryl Ada Y, DO      . ondansetron (ZOFRAN) injection 4 mg  4 mg Intravenous Q6H PRN Caryl Ada Y, DO      . oxyCODONE-acetaminophen (PERCOCET/ROXICET) 5-325 MG per tablet 1 tablet  1 tablet Oral Q4H PRN Pincus Large, DO      . oxyCODONE-acetaminophen (PERCOCET/ROXICET) 5-325 MG per tablet 2 tablet  2 tablet Oral Q4H PRN Pincus Large, DO      . oxytocin (PITOCIN) IV BOLUS FROM BAG  500 mL Intravenous Once Doroteo Glassman, Jazma Y, DO      . oxytocin (PITOCIN) IV infusion 40 units in LR 1000 mL - Premix  2.5 Units/hr Intravenous Continuous Pincus Large, DO      .  oxytocin (PITOCIN) IV infusion 40 units in LR 1000 mL - Premix  1-40 milli-units/min Intravenous Titrated Freddrick MarchAmin, Yashika, MD   Stopped at 12/20/17 0507  . penicillin G potassium 3 Million Units in dextrose 50mL IVPB  3 Million Units Intravenous Q4H Pincus Largehelps, Jazma Y, DO 100 mL/hr at 12/20/17 0610 3 Million Units at 12/20/17 0610  . PHENYLephrine 40 mcg/ml in normal saline Adult IV Push Syringe  80 mcg Intravenous PRN Phillips Groutarignan, Peter, MD      . PHENYLephrine 40 mcg/ml in normal saline Adult IV Push Syringe  80 mcg Intravenous PRN Phillips Groutarignan, Peter, MD      . sodium citrate-citric acid (ORACIT) solution 30 mL  30 mL Oral Q2H PRN Caryl AdaPhelps, Jazma Y, DO      . sodium phosphate (FLEET) 7-19 GM/118ML enema 1 enema  1 enema Rectal PRN Pincus LargePhelps, Jazma Y, DO       Facility-Administered Medications Ordered in Other Encounters  Medication Dose Route Frequency Provider Last Rate Last Dose  . lidocaine (PF) (XYLOCAINE) 1 % injection    Anesthesia Intra-op Phillips Groutarignan, Peter, MD   5 mL at 12/20/17 0411     Assessment & Plan:  Pt stable *IUP: currently category I after SVE with accels. Pt was on LLR will flip to other side and do 1L LR wide open bolus. Will reassess in 45-50m and if doing well can try low dose pitocin after SVE. Continue with AI at 150/hr and has FSE in place already. Hopefully can get pt into active labor quickly and labor go quickly since she is a multip.  *IOL: see above. Pelvis feels adequate. efw 3400gm.  *GBS: pos. S/p pcn x 2 already *Analgesia: comfortable  Cornelia Copaharlie Semya Klinke, Jr. MD Attending Center for [facility](Faculty Practice)

## 2017-12-20 NOTE — Anesthesia Procedure Notes (Signed)
Epidural Patient location during procedure: OB  Staffing Anesthesiologist: Kavina Cantave, MD Performed: anesthesiologist   Preanesthetic Checklist Completed: patient identified, site marked, surgical consent, pre-op evaluation, timeout performed, IV checked, risks and benefits discussed and monitors and equipment checked  Epidural Patient position: sitting Prep: DuraPrep Patient monitoring: heart rate, continuous pulse ox and blood pressure Approach: right paramedian Location: L3-L4 Injection technique: LOR saline  Needle:  Needle type: Tuohy  Needle gauge: 17 G Needle length: 9 cm and 9 Needle insertion depth: 7 cm Catheter type: closed end flexible Catheter size: 20 Guage Catheter at skin depth: 11 cm Test dose: negative  Assessment Events: blood not aspirated, injection not painful, no injection resistance, negative IV test and no paresthesia  Additional Notes Patient identified. Risks/Benefits/Options discussed with patient including but not limited to bleeding, infection, nerve damage, paralysis, failed block, incomplete pain control, headache, blood pressure changes, nausea, vomiting, reactions to medication both or allergic, itching and postpartum back pain. Confirmed with bedside nurse the patient's most recent platelet count. Confirmed with patient that they are not currently taking any anticoagulation, have any bleeding history or any family history of bleeding disorders. Patient expressed understanding and wished to proceed. All questions were answered. Sterile technique was used throughout the entire procedure. Please see nursing notes for vital signs. Test dose was given through epidural needle and negative prior to continuing to dose epidural or start infusion. Warning signs of high block given to the patient including shortness of breath, tingling/numbness in hands, complete motor block, or any concerning symptoms with instructions to call for help. Patient was given  instructions on fall risk and not to get out of bed. All questions and concerns addressed with instructions to call with any issues.     

## 2017-12-21 ENCOUNTER — Encounter (HOSPITAL_COMMUNITY): Payer: Self-pay | Admitting: Obstetrics and Gynecology

## 2017-12-21 ENCOUNTER — Other Ambulatory Visit: Payer: Self-pay

## 2017-12-21 LAB — CBC
HCT: 31.1 % — ABNORMAL LOW (ref 36.0–46.0)
Hemoglobin: 10.6 g/dL — ABNORMAL LOW (ref 12.0–15.0)
MCH: 31.3 pg (ref 26.0–34.0)
MCHC: 34.1 g/dL (ref 30.0–36.0)
MCV: 91.7 fL (ref 78.0–100.0)
PLATELETS: 237 10*3/uL (ref 150–400)
RBC: 3.39 MIL/uL — ABNORMAL LOW (ref 3.87–5.11)
RDW: 13.7 % (ref 11.5–15.5)
WBC: 14.4 10*3/uL — ABNORMAL HIGH (ref 4.0–10.5)

## 2017-12-21 NOTE — Progress Notes (Signed)
Post Partum Day 1  Subjective: no complaints, up ad lib, voiding and tolerating PO  No BMs or flatus  Denies any pain around incision site Denies any CP, SOB.  Objective: Blood pressure 98/62, pulse 78, temperature 98.1 F (36.7 C), temperature source Oral, resp. rate 18, height 5\' 2"  (1.575 m), weight 106.1 kg (234 lb), SpO2 98 %, unknown if currently breastfeeding.  Physical Exam:  General: alert and no distress Lochia: appropriate Uterine Fundus: firm Incision: healing well, no significant drainage. Clean and dry  DVT Evaluation: No evidence of DVT seen on physical exam. Pulmonary: Lungs clear to auscultation bilaterally  Abdomen: Soft, appropriately tender, no rebound.   Recent Labs    12/20/17 0134 12/21/17 0507  HGB 12.4 10.6*  HCT 36.8 31.1*   CBC Latest Ref Rng & Units 12/21/2017 12/20/2017 12/14/2017  WBC 4.0 - 10.5 K/uL 14.4(H) 11.8(H) 10.2  Hemoglobin 12.0 - 15.0 g/dL 10.6(L) 12.4 12.4  Hematocrit 36.0 - 46.0 % 31.1(L) 36.8 37.3  Platelets 150 - 400 K/uL 237 257 261     Assessment/Plan: 41 yr 854P4 female s/p pLTCS for fetal intolerance of labor POD #1 Doing well.  Plan for discharge tomorrow, Breastfeeding and Contraception IUD     LOS: 1 day   Ilsa Ihaicole Sha Amer 12/21/2017, 9:18 AM

## 2017-12-22 MED ORDER — IBUPROFEN 600 MG PO TABS: 600.0000 mg | ORAL_TABLET | Freq: Four times a day (QID) | ORAL | 0 refills | Status: DC

## 2017-12-22 MED ORDER — SENNOSIDES-DOCUSATE SODIUM 8.6-50 MG PO TABS: 1.0000 | ORAL_TABLET | Freq: Every day | ORAL | 0 refills | Status: DC

## 2017-12-22 MED ORDER — FERROUS SULFATE 325 (65 FE) MG PO TABS: 325.0000 mg | ORAL_TABLET | Freq: Every day | ORAL | 0 refills | Status: DC

## 2017-12-22 MED ORDER — OXYCODONE HCL 5 MG PO TABS: 5.0000 mg | ORAL_TABLET | Freq: Four times a day (QID) | ORAL | 0 refills | Status: DC | PRN

## 2017-12-22 NOTE — Discharge Summary (Signed)
OB Discharge Summary     Patient Name: Denise ScottGina L Andrade DOB: 1976/10/15 MRN: [id number] Date of admission: 12/20/2017  Delivering MD: [place] BingPICKENS, CHARLIE   Date of discharge: 12/22/2017  Admitting diagnosis: Spontaneous rupture of membranes Intrauterine pregnancy: 2085w0d    Secondary diagnosis:  Active Problems:   Patient Active Problem List   Diagnosis Date Noted  . Indication for care in labor or delivery 12/20/2017  . S/P primary low transverse C-section 12/20/2017    Additional problems: none     Discharge diagnosis: Term Pregnancy Delivered                                                                                                Post partum procedures: none  Augmentation: Pitocin  Hospital course:  Onset of Labor With Unplanned C/S  42 y.o. yo [id number] at 4085w0d was admitted in Active Labor on 12/20/2017. Membrane Rupture Time/Date: 9:00 PM ,12/19/2017    The patient went for cesarean section due to Non-Reassuring FHR, and delivered a Viable infant,12/20/2017  Details of operation can be found in separate operative note. Patient had an uncomplicated postpartum course.  She is ambulating,tolerating a regular diet, passing flatus, and urinating well.  Patient is discharged home in stable condition 12/22/17.  Physical exam  Vitals:   12/21/17 1735 12/22/17 0520  BP: 95/73 120/82  Pulse: 83 86  Resp: 18 16  Temp: 98.2 F (36.8 C) 97.7 F (36.5 C)  SpO2:      General: alert, cooperative and no distress Lochia: appropriate Uterine Fundus: firm Incision: Healing well with no significant drainage, No significant erythema, Dressing is clean, dry, and intact DVT Evaluation: No evidence of DVT seen on physical exam.  Labs:     Component Value Date/Time   WBC 14.4 (H) 12/21/2017 0507   RBC 3.39 (L) 12/21/2017 0507   HGB 10.6 (L) 12/21/2017 0507   HCT 31.1 (L) 12/21/2017 0507   PLT 237 12/21/2017 0507   MCV 91.7 12/21/2017 0507   MCH 31.3 12/21/2017 0507   MCHC 34.1 12/21/2017  0507   RDW 13.7 12/21/2017 0507   LYMPHSABS 2.0 12/14/2017 1830   MONOABS 0.4 12/14/2017 1830   EOSABS 0.1 12/14/2017 1830   BASOSABS 0.0 12/14/2017 1830    Discharge instruction: per After Visit Summary and "Baby and Me Booklet".  After visit meds:  No Known Allergies  Allergies as of 12/22/2017   No Known Allergies     Medication List    TAKE these medications   acetaminophen 325 MG tablet Commonly known as:  TYLENOL Take 325 mg by mouth every 6 (six) hours as needed for mild pain or headache.   ferrous sulfate 325 (65 FE) MG tablet Take 1 tablet (325 mg total) by mouth daily with breakfast. Start taking on:  12/23/2017   ibuprofen 600 MG tablet Commonly known as:  ADVIL,MOTRIN Take 1 tablet (600 mg total) by mouth every 6 (six) hours.   oxyCODONE 5 MG immediate release tablet Commonly known as:  Oxy IR/ROXICODONE Take 1 tablet (5 mg total) by mouth every 6 (six) hours as needed for severe pain  or breakthrough pain.   senna-docusate 8.6-50 MG tablet Commonly known as:  Senokot-S Take 1 tablet by mouth at bedtime.   VIRT-C DHA 53.5-38-1 MG Caps Take 1 capsule by mouth daily.       Diet: routine diet  Activity: Advance as tolerated. Pelvic rest for 6 weeks.   Outpatient follow up:4 weeks Future Appointments: No future appointments.  Follow up Appt:  Follow-up Information    Department, [facility]. Schedule an appointment as soon as possible for a visit.   Why:  For postpartum visit Contact information: [address] [place] [address] [phone]            Postpartum contraception: IUD Mirena  Newborn Data: APGAR (1 MIN): 8   APGAR (5 MINS): 9   APGAR (10 MINS):   weight 6 lb 11.4 oz (3045 g)  Baby Feeding: Bottle and Breast Disposition:home with mother  Larose Hires, Medical Student 12/22/2017  OB FELLOW DISCHARGE ATTESTATION  I have seen and examined this patient. I agree with above documentation and have  made edits as needed.   Caryl Ada, DO OB Fellow

## 2017-12-22 NOTE — Lactation Note (Signed)
This note was copied from a baby's chart. Lactation Consultation Note  Patient Name: Boy Celedonio SavageGina Kurt WGNFA'OToday's Date: 12/22/2017 Reason for consult: Initial assessment;Other (Comment);Term;Infant weight loss  Baby is 45 hours old  As LC entered the room mom holding baby and just finished feeding a bottle 15 ml Per mom plans to just pump and bottle feed and have a DEBP Medela at home.  LC reviewed supply and demand and the importance  Of consistent pumping both  Breast around the clock for 15 -20 mins.  Sore nipple and engorgement prevention and tx reviewed .  S/S of mastitis reviewed.  Mother informed of post-discharge support and given phone number to the lactation department, including services for phone call assistance; out-patient appointments; and breastfeeding support group. List of other breastfeeding resources in the community given in the handout. Encouraged mother to call for problems or concerns related to breastfeeding.  LC mentioned to mom there is many options with breast feeding.  She may find to have her milk come in quicker latching at the baby for some feedings can help let down. Mom receptive to conversation.    Maternal Data Has patient been taught Hand Expression?: Yes(per mom feels comfortable )  Feeding    LATCH Score                   Interventions Interventions: Breast feeding basics reviewed  Lactation Tools Discussed/Used WIC Program: No   Consult Status Consult Status: Complete    Matilde SprangMargaret Ann Anairis Knick 12/22/2017, 12:30 PM

## 2017-12-22 NOTE — Discharge Instructions (Signed)

## 2019-07-05 ENCOUNTER — Emergency Department (HOSPITAL_COMMUNITY): Payer: 59

## 2019-07-05 ENCOUNTER — Observation Stay (HOSPITAL_COMMUNITY)
Admission: EM | Admit: 2019-07-05 | Discharge: 2019-07-07 | Disposition: A | Discharge status: Home / Self Care | Payer: 59 | Attending: General Surgery | Admitting: General Surgery

## 2019-07-05 ENCOUNTER — Encounter (HOSPITAL_COMMUNITY): Payer: Self-pay | Admitting: Obstetrics and Gynecology

## 2019-07-05 ENCOUNTER — Other Ambulatory Visit: Payer: Self-pay

## 2019-07-05 DIAGNOSIS — K810 Acute cholecystitis: Secondary | ICD-10-CM

## 2019-07-05 DIAGNOSIS — Z6841 Body Mass Index (BMI) 40.0 and over, adult: Secondary | ICD-10-CM | POA: Insufficient documentation

## 2019-07-05 DIAGNOSIS — K8010 Calculus of gallbladder with chronic cholecystitis without obstruction: Secondary | ICD-10-CM | POA: Diagnosis not present

## 2019-07-05 DIAGNOSIS — Z87891 Personal history of nicotine dependence: Secondary | ICD-10-CM | POA: Insufficient documentation

## 2019-07-05 DIAGNOSIS — K8020 Calculus of gallbladder without cholecystitis without obstruction: Secondary | ICD-10-CM | POA: Diagnosis present

## 2019-07-05 DIAGNOSIS — Z1159 Encounter for screening for other viral diseases: Secondary | ICD-10-CM | POA: Insufficient documentation

## 2019-07-05 DIAGNOSIS — N200 Calculus of kidney: Secondary | ICD-10-CM | POA: Insufficient documentation

## 2019-07-05 DIAGNOSIS — R1010 Upper abdominal pain, unspecified: Secondary | ICD-10-CM | POA: Diagnosis present

## 2019-07-05 LAB — CBC WITH DIFFERENTIAL/PLATELET
Abs Immature Granulocytes: 0.07 10*3/uL (ref 0.00–0.07)
Basophils Absolute: 0 10*3/uL (ref 0.0–0.1)
Basophils Relative: 0 %
Eosinophils Absolute: 0 10*3/uL (ref 0.0–0.5)
Eosinophils Relative: 0 %
HCT: 41.3 % (ref 36.0–46.0)
Hemoglobin: 13.7 g/dL (ref 12.0–15.0)
Immature Granulocytes: 1 %
Lymphocytes Relative: 6 %
Lymphs Abs: 0.8 10*3/uL (ref 0.7–4.0)
MCH: 30.6 pg (ref 26.0–34.0)
MCHC: 33.2 g/dL (ref 30.0–36.0)
MCV: 92.4 fL (ref 80.0–100.0)
Monocytes Absolute: 0.2 10*3/uL (ref 0.1–1.0)
Monocytes Relative: 2 %
Neutro Abs: 11.1 10*3/uL — ABNORMAL HIGH (ref 1.7–7.7)
Neutrophils Relative %: 91 %
Platelets: 251 10*3/uL (ref 150–400)
RBC: 4.47 MIL/uL (ref 3.87–5.11)
RDW: 12 % (ref 11.5–15.5)
WBC: 12.2 10*3/uL — ABNORMAL HIGH (ref 4.0–10.5)
nRBC: 0 % (ref 0.0–0.2)

## 2019-07-05 LAB — COMPREHENSIVE METABOLIC PANEL
ALT: 43 U/L (ref 0–44)
AST: 31 U/L (ref 15–41)
Albumin: 4.1 g/dL (ref 3.5–5.0)
Alkaline Phosphatase: 89 U/L (ref 38–126)
Anion gap: 6 (ref 5–15)
BUN: 11 mg/dL (ref 6–20)
CO2: 23 mmol/L (ref 22–32)
Calcium: 9.1 mg/dL (ref 8.9–10.3)
Chloride: 103 mmol/L (ref 98–111)
Creatinine, Ser: 0.61 mg/dL (ref 0.44–1.00)
GFR calc Af Amer: >60 mL/min (ref >60)
GFR calc non Af Amer: >60 mL/min (ref >60)
Glucose, Bld: 117 mg/dL — ABNORMAL HIGH (ref 70–99)
Potassium: 3.6 mmol/L (ref 3.5–5.1)
Sodium: 132 mmol/L — ABNORMAL LOW (ref 135–145)
Total Bilirubin: 0.6 mg/dL (ref 0.3–1.2)
Total Protein: 8.2 g/dL — ABNORMAL HIGH (ref 6.5–8.1)

## 2019-07-05 LAB — URINALYSIS, ROUTINE W REFLEX MICROSCOPIC
Bacteria, UA: NONE SEEN
Bilirubin Urine: NEGATIVE
Glucose, UA: NEGATIVE mg/dL
Ketones, ur: 20 mg/dL — AB
Leukocytes,Ua: NEGATIVE
Nitrite: NEGATIVE
Protein, ur: NEGATIVE mg/dL
Specific Gravity, Urine: 1.012 (ref 1.005–1.030)
pH: 6 (ref 5.0–8.0)

## 2019-07-05 LAB — LIPASE, BLOOD: Lipase: 34 U/L (ref 11–51)

## 2019-07-05 LAB — I-STAT BETA HCG BLOOD, ED (MC, WL, AP ONLY): I-stat hCG, quantitative: <5 m[IU]/mL (ref <5)

## 2019-07-05 LAB — SARS CORONAVIRUS 2 BY RT PCR (HOSPITAL ORDER, PERFORMED IN ~~LOC~~ HOSPITAL LAB): SARS Coronavirus 2: NEGATIVE

## 2019-07-05 MED ORDER — HYDROMORPHONE HCL 1 MG/ML IJ SOLN
1.0000 mg | Freq: Once | INTRAMUSCULAR | Status: AC
  Administered 2019-07-05: 1 mg via INTRAVENOUS
  Filled 2019-07-05: qty 1

## 2019-07-05 MED ORDER — SODIUM CHLORIDE 0.9 % IV BOLUS
1000.0000 mL | Freq: Once | INTRAVENOUS | Status: AC
  Administered 2019-07-05: 1000 mL via INTRAVENOUS

## 2019-07-05 MED ORDER — ONDANSETRON HCL 4 MG/2ML IJ SOLN
4.0000 mg | Freq: Once | INTRAMUSCULAR | Status: AC
  Administered 2019-07-05: 4 mg via INTRAVENOUS
  Filled 2019-07-05: qty 2

## 2019-07-05 MED ORDER — SODIUM CHLORIDE (PF) 0.9 % IJ SOLN
INTRAMUSCULAR | Status: AC
  Filled 2019-07-05: qty 50

## 2019-07-05 MED ORDER — SODIUM CHLORIDE 0.9 % IV SOLN
2.0000 g | Freq: Once | INTRAVENOUS | Status: AC
  Administered 2019-07-05: 2 g via INTRAVENOUS
  Filled 2019-07-05: qty 20

## 2019-07-05 MED ORDER — HYDROMORPHONE HCL 1 MG/ML IJ SOLN
0.5000 mg | Freq: Once | INTRAMUSCULAR | Status: AC
  Administered 2019-07-05: 0.5 mg via INTRAVENOUS
  Filled 2019-07-05: qty 1

## 2019-07-05 MED ORDER — IOHEXOL 300 MG/ML  SOLN
100.0000 mL | Freq: Once | INTRAMUSCULAR | Status: AC | PRN
  Administered 2019-07-05: 100 mL via INTRAVENOUS

## 2019-07-05 NOTE — ED Notes (Signed)
ED TO INPATIENT HANDOFF REPORT  Name/Age/Gender Denise Andrade 43 y.o. female  Code Status Code Status History    Date Active Date Inactive Code Status Order ID Comments User Context   12/20/2017 0132 12/20/2017 1724 Full Code [id number]  Verdene RioFields, Lauren A, RN Inpatient   Advance Care Planning Activity      Home/SNF/Other Home  Chief Complaint abd pain; emesis  Level of Care/Admitting Diagnosis ED Disposition    ED Disposition Condition Comment   Admit  [facility] [[id number]]  Level of Care: Med-Surg [16]  Covid Evaluation: Asymptomatic Screening Protocol (No Symptoms)  Diagnosis: Gallstones [284132][244317]  Admitting Physician: Jayme CloudTH III, PAUL [[id number]]  Attending Physician: CCS, MD [[id number]]  PT Class (Do Not Modify): Observation [104]  PT Acc Code (Do Not Modify): Observation [10022]       Medical History Past Medical History:  Diagnosis Date  . Medical history non-contributory     Allergies No Known Allergies  IV Location/Drains/Wounds Patient Lines/Drains/Airways Status   Active Line/Drains/Airways    Name:   Placement date:   Placement time:   Site:   Days:   Peripheral IV 07/05/19 Right Antecubital   07/05/19    1718    Antecubital   less than 1   Incision (Closed) 12/20/17 Abdomen   12/20/17    1522     562   Incision (Closed) 12/20/17 Perineum   12/20/17    1522     562          Labs/Imaging Results for orders placed or performed during the hospital encounter of 07/05/19 (from the past 48 hour(s))  Comprehensive metabolic panel     Status: Abnormal   Collection Time: 07/05/19  5:23 PM  Result Value Ref Range   Sodium 132 (L) 135 - 145 mmol/L   Potassium 3.6 3.5 - 5.1 mmol/L   Chloride 103 98 - 111 mmol/L   CO2 23 22 - 32 mmol/L   Glucose, Bld 117 (H) 70 - 99 mg/dL   BUN 11 6 - 20 mg/dL   Creatinine, Ser 4.400.61 0.44 - 1.00 mg/dL   Calcium 9.1 8.9 - 10.210.3 mg/dL   Total Protein 8.2 (H) 6.5 - 8.1 g/dL   Albumin 4.1 3.5 - 5.0 g/dL    AST 31 15 - 41 U/L   ALT 43 0 - 44 U/L   Alkaline Phosphatase 89 38 - 126 U/L   Total Bilirubin 0.6 0.3 - 1.2 mg/dL   GFR calc non Af Amer >60 >60 mL/min   GFR calc Af Amer >60 >60 mL/min   Anion gap 6 5 - 15    Comment: Performed at [facility], [address] [place], [place] [address]  CBC with Differential     Status: Abnormal   Collection Time: 07/05/19  5:23 PM  Result Value Ref Range   WBC 12.2 (H) 4.0 - 10.5 K/uL   RBC 4.47 3.87 - 5.11 MIL/uL   Hemoglobin 13.7 12.0 - 15.0 g/dL   HCT 64.441.3 03.436.0 - 74.246.0 %   MCV 92.4 80.0 - 100.0 fL   MCH 30.6 26.0 - 34.0 pg   MCHC 33.2 30.0 - 36.0 g/dL   RDW 59.512.0 63.811.5 - 75.615.5 %   Platelets 251 150 - 400 K/uL   nRBC 0.0 0.0 - 0.2 %   Neutrophils Relative % 91 %   Neutro Abs 11.1 (H) 1.7 - 7.7 K/uL   Lymphocytes Relative 6 %   Lymphs Abs  0.8 0.7 - 4.0 K/uL   Monocytes Relative 2 %   Monocytes Absolute 0.2 0.1 - 1.0 K/uL   Eosinophils Relative 0 %   Eosinophils Absolute 0.0 0.0 - 0.5 K/uL   Basophils Relative 0 %   Basophils Absolute 0.0 0.0 - 0.1 K/uL   Immature Granulocytes 1 %   Abs Immature Granulocytes 0.07 0.00 - 0.07 K/uL    Comment: Performed at [facility], [place] [address], [place], [place] [address]  Lipase, blood     Status: None   Collection Time: 07/05/19  5:23 PM  Result Value Ref Range   Lipase 34 11 - 51 U/L    Comment: Performed at [facility], [place] [address] [place], [place] [address]  I-Stat beta hCG blood, ED     Status: None   Collection Time: 07/05/19  5:27 PM  Result Value Ref Range   I-stat hCG, quantitative <5.0 <5 mIU/mL   Comment 3            Comment:   GEST. AGE      CONC.  (mIU/mL)   <=1 WEEK        5 - 50     2 WEEKS       50 - 500     3 WEEKS       100 - 10,000     4 WEEKS     1,000 - 30,000        FEMALE AND NON-PREGNANT FEMALE:     LESS THAN 5 mIU/mL   Urinalysis, Routine w reflex microscopic     Status: Abnormal   Collection Time:  07/05/19  7:26 PM  Result Value Ref Range   Color, Urine YELLOW YELLOW   APPearance CLEAR CLEAR   Specific Gravity, Urine 1.012 1.005 - 1.030   pH 6.0 5.0 - 8.0   Glucose, UA NEGATIVE NEGATIVE mg/dL   Hgb urine dipstick SMALL (A) NEGATIVE   Bilirubin Urine NEGATIVE NEGATIVE   Ketones, ur 20 (A) NEGATIVE mg/dL   Protein, ur NEGATIVE NEGATIVE mg/dL   Nitrite NEGATIVE NEGATIVE   Leukocytes,Ua NEGATIVE NEGATIVE   RBC / HPF 0-5 0 - 5 RBC/hpf   WBC, UA 0-5 0 - 5 WBC/hpf   Bacteria, UA NONE SEEN NONE SEEN   Squamous Epithelial / LPF 0-5 0 - 5   Mucus PRESENT     Comment: Performed at [facility], [place] [address] [place], [place] [address]   Ct Abdomen Pelvis W Contrast  Result Date: 07/05/2019 CLINICAL DATA:  43 year old female with left upper quadrant epigastric pain and nausea. EXAM: CT ABDOMEN AND PELVIS WITH CONTRAST TECHNIQUE: Multidetector CT imaging of the abdomen and pelvis was performed using the standard protocol following bolus administration of intravenous contrast. CONTRAST:  126mL OMNIPAQUE IOHEXOL 300 MG/ML  SOLN COMPARISON:  Abdominal ultrasound dated 07/05/2019 FINDINGS: Lower chest: The visualized lung bases are clear. No intra-abdominal free air or free fluid. Hepatobiliary: The liver is unremarkable. There is mild intrahepatic biliary ductal dilatation or periportal edema. The gallbladder is distended. There is a small stone in the proximal portion of the gallbladder. A second small stone noted at the gallbladder neck (series 2, image 26 and coronal series 5, image 73) with possible obstruction of the gallbladder neck. There is a small pericholecystic fluid. An early acute cholecystitis is not excluded. Clinical correlation is recommended. Repeat ultrasound or further evaluation with HIDA scan may provide better evaluation. Pancreas: Unremarkable. No pancreatic ductal  dilatation or surrounding inflammatory changes. Spleen: Normal in size without focal  abnormality. Adrenals/Urinary Tract: The adrenal glands are unremarkable. There is a 3 mm non obstructing right renal upper pole calculus. No hydronephrosis. There is no hydronephrosis or nephrolithiasis on the left. There is symmetric enhancement and excretion of contrast by both kidneys. The visualized ureters appear unremarkable. The urinary bladder is collapsed. Stomach/Bowel: The stomach is distended with liquid content. No evidence of gastric outlet obstruction. There is no bowel obstruction or active inflammation. The appendix is normal. Vascular/Lymphatic: The abdominal aorta and IVC are unremarkable. No portal venous gas. Top-normal lymph node along the distal esophagus measures 10 mm in short axis. Additional top-normal lymph node at the right cardiophrenic angle noted. No retroperitoneal adenopathy. Reproductive: The uterus is anteverted and grossly unremarkable. Dominant right ovarian follicles/cysts. The left ovary is unremarkable. Other: None Musculoskeletal: Disc desiccation at L5-S1. L1 hemangioma. No acute osseous pathology. IMPRESSION: 1. Cholelithiasis with findings concerning for early acute cholecystitis. Clinical correlation is recommended. Repeat ultrasound or further evaluation with HIDA scan may provide better evaluation. 2. No bowel obstruction or active inflammation. Normal appendix. 3. A 3 mm nonobstructing right renal upper pole calculus. No hydronephrosis. Electronically Signed   By: Elgie CollardArash  Radparvar M.D.   On: 07/05/2019 20:29   Dg Chest Portable 1 View  Result Date: 07/05/2019 CLINICAL DATA:  Chest and abdominal pain EXAM: PORTABLE CHEST 1 VIEW COMPARISON:  Portable exam 1620 hours without priors for comparison FINDINGS: Upper normal heart size. Mediastinal contours and pulmonary vascularity normal. Lungs clear. No pulmonary infiltrate, pleural effusion or pneumothorax. Bones unremarkable. IMPRESSION: No acute abnormalities. Electronically Signed   By: Ulyses SouthwardMark  Boles M.D.   On:  07/05/2019 16:57   [place] Abdomen Limited Ruq  Result Date: 07/05/2019 CLINICAL DATA:  Upper abdominal pain. EXAM: ULTRASOUND ABDOMEN LIMITED RIGHT UPPER QUADRANT COMPARISON:  None. FINDINGS: Gallbladder: Sludge. No gallstones or wall thickening visualized. Trace pericholecystic fluid. Sonographic Eulah PontMurphy sign is indeterminate given recent pain medication administration. There are at least three polyps measuring up to 4 mm. Common bile duct: Diameter: 3 mm, normal. Liver: No focal lesion identified. Within normal limits in parenchymal echogenicity. Portal vein is patent on color Doppler imaging with normal direction of blood flow towards the liver. IMPRESSION: 1. Gallbladder sludge and trace pericholecystic fluid. No gallstones or wall thickening. Acute cholecystitis is unlikely. 2. Three gallbladder polyps measuring up to 4 mm. No further follow-up required. This recommendation follows ACR consensus guidelines: White Paper of the ACR Incidental Findings Committee II on Gallbladder and Biliary Findings. J Am Coll Radiol 2013:;10:953-956. Electronically Signed   By: Obie DredgeWilliam T Derry M.D.   On: 07/05/2019 19:28    Pending Labs Unresulted Labs (From admission, onward)    Start     Ordered   07/05/19 2043  SARS Coronavirus 2 (CEPHEID - Performed in [facility] hospital lab), Hosp Order  (Asymptomatic Patients Labs)  Once,   STAT    Question:  Rule Out  Answer:  Yes   07/05/19 2042   Signed and Held  HIV antibody (Routine Testing)  Once,   R     Signed and Held          Vitals/Pain Today's Vitals   07/05/19 1713 07/05/19 1813 07/05/19 2211 07/05/19 2212  BP: 131/76 (!) 125/92 117/79   Pulse: 75 81 82   Resp: 19 20 16    Temp:      TempSrc:      SpO2: 100% 100% 100%   Weight:  Height:      PainSc:    4     Isolation Precautions No active isolations  Medications Medications  sodium chloride (PF) 0.9 % injection (has no administration in time range)  ondansetron (ZOFRAN) injection 4 mg  (4 mg Intravenous Given 07/05/19 1719)  HYDROmorphone (DILAUDID) injection 1 mg (1 mg Intravenous Given 07/05/19 1720)  sodium chloride 0.9 % bolus 1,000 mL (0 mLs Intravenous Stopped 07/05/19 1848)  HYDROmorphone (DILAUDID) injection 0.5 mg (0.5 mg Intravenous Given 07/05/19 2045)  iohexol (OMNIPAQUE) 300 MG/ML solution 100 mL (100 mLs Intravenous Contrast Given 07/05/19 1950)  cefTRIAXone (ROCEPHIN) 2 g in sodium chloride 0.9 % 100 mL IVPB (2 g Intravenous New Bag/Given 07/05/19 2212)    Mobility walks

## 2019-07-05 NOTE — ED Notes (Signed)
Pt. Aware of urine specimen. Will collect urine when pt. Voids. Nurse aware.  

## 2019-07-05 NOTE — ED Notes (Signed)
Attempted to call report, was informed that nurse will call me back when available.

## 2019-07-05 NOTE — H&P (Signed)
Denise Andrade is an 43 y.o. female.   Chief Complaint: abd pain HPI: The patient is a 43 year old female who presents with upper abd pain that started around 4am. Pain has not subsided. Pain is associated with nausea and vomiting. She came to ER where CT shows gallstones. lft's normal  Past Medical History:  Diagnosis Date  . Medical history non-contributory     Past Surgical History:  Procedure Laterality Date  . CESAREAN SECTION N/A 12/20/2017   Procedure: CESAREAN SECTION;  Surgeon: Daniel BingPickens, Charlie, MD;  [facility];  Service: Obstetrics;  Laterality: N/A;  . NO PAST SURGERIES      No family history on file. Social History:  reports that she has quit smoking. She has never used smokeless tobacco. She reports current alcohol use. She reports that she does not use drugs.  Allergies: No Known Allergies  (Not in a hospital admission)   Results for orders placed or performed during the hospital encounter of 07/05/19 (from the past 48 hour(s))  Comprehensive metabolic panel     Status: Abnormal   Collection Time: 07/05/19  5:23 PM  Result Value Ref Range   Sodium 132 (L) 135 - 145 mmol/L   Potassium 3.6 3.5 - 5.1 mmol/L   Chloride 103 98 - 111 mmol/L   CO2 23 22 - 32 mmol/L   Glucose, Bld 117 (H) 70 - 99 mg/dL   BUN 11 6 - 20 mg/dL   Creatinine, Ser 1.610.61 0.44 - 1.00 mg/dL   Calcium 9.1 8.9 - 09.610.3 mg/dL   Total Protein 8.2 (H) 6.5 - 8.1 g/dL   Albumin 4.1 3.5 - 5.0 g/dL   AST 31 15 - 41 U/L   ALT 43 0 - 44 U/L   Alkaline Phosphatase 89 38 - 126 U/L   Total Bilirubin 0.6 0.3 - 1.2 mg/dL   GFR calc non Af Amer >60 >60 mL/min   GFR calc Af Amer >60 >60 mL/min   Anion gap 6 5 - 15    Comment: Performed at [facility], [address] [place], [place] [address]  CBC with Differential     Status: Abnormal   Collection Time: 07/05/19  5:23 PM  Result Value Ref Range   WBC 12.2 (H) 4.0 - 10.5 K/uL   RBC 4.47 3.87 - 5.11 MIL/uL   Hemoglobin  13.7 12.0 - 15.0 g/dL   HCT 98.141.3 19.136.0 - 47.846.0 %   MCV 92.4 80.0 - 100.0 fL   MCH 30.6 26.0 - 34.0 pg   MCHC 33.2 30.0 - 36.0 g/dL   RDW 29.512.0 62.111.5 - 30.815.5 %   Platelets 251 150 - 400 K/uL   nRBC 0.0 0.0 - 0.2 %   Neutrophils Relative % 91 %   Neutro Abs 11.1 (H) 1.7 - 7.7 K/uL   Lymphocytes Relative 6 %   Lymphs Abs 0.8 0.7 - 4.0 K/uL   Monocytes Relative 2 %   Monocytes Absolute 0.2 0.1 - 1.0 K/uL   Eosinophils Relative 0 %   Eosinophils Absolute 0.0 0.0 - 0.5 K/uL   Basophils Relative 0 %   Basophils Absolute 0.0 0.0 - 0.1 K/uL   Immature Granulocytes 1 %   Abs Immature Granulocytes 0.07 0.00 - 0.07 K/uL    Comment: Performed at [facility], [address] [place], [place] [address]  Lipase, blood     Status: None   Collection Time: 07/05/19  5:23 PM  Result Value Ref  Range   Lipase 34 11 - 51 U/L    Comment: Performed at [facility], [address] [place], [place] [address]  I-Stat beta hCG blood, ED     Status: None   Collection Time: 07/05/19  5:27 PM  Result Value Ref Range   I-stat hCG, quantitative <5.0 <5 mIU/mL   Comment 3            Comment:   GEST. AGE      CONC.  (mIU/mL)   <=1 WEEK        5 - 50     2 WEEKS       50 - 500     3 WEEKS       100 - 10,000     4 WEEKS     1,000 - 30,000        FEMALE AND NON-PREGNANT FEMALE:     LESS THAN 5 mIU/mL   Urinalysis, Routine w reflex microscopic     Status: Abnormal   Collection Time: 07/05/19  7:26 PM  Result Value Ref Range   Color, Urine YELLOW YELLOW   APPearance CLEAR CLEAR   Specific Gravity, Urine 1.012 1.005 - 1.030   pH 6.0 5.0 - 8.0   Glucose, UA NEGATIVE NEGATIVE mg/dL   Hgb urine dipstick SMALL (A) NEGATIVE   Bilirubin Urine NEGATIVE NEGATIVE   Ketones, ur 20 (A) NEGATIVE mg/dL   Protein, ur NEGATIVE NEGATIVE mg/dL   Nitrite NEGATIVE NEGATIVE   Leukocytes,Ua NEGATIVE NEGATIVE   RBC / HPF 0-5 0 - 5 RBC/hpf   WBC, UA 0-5 0 - 5 WBC/hpf   Bacteria, UA NONE SEEN  NONE SEEN   Squamous Epithelial / LPF 0-5 0 - 5   Mucus PRESENT     Comment: Performed at [facility], [address] [place], [place] [address]   Ct Abdomen Pelvis W Contrast  Result Date: 07/05/2019 CLINICAL DATA:  43 year old female with left upper quadrant epigastric pain and nausea. EXAM: CT ABDOMEN AND PELVIS WITH CONTRAST TECHNIQUE: Multidetector CT imaging of the abdomen and pelvis was performed using the standard protocol following bolus administration of intravenous contrast. CONTRAST:  100mL OMNIPAQUE IOHEXOL 300 MG/ML  SOLN COMPARISON:  Abdominal ultrasound dated 07/05/2019 FINDINGS: Lower chest: The visualized lung bases are clear. No intra-abdominal free air or free fluid. Hepatobiliary: The liver is unremarkable. There is mild intrahepatic biliary ductal dilatation or periportal edema. The gallbladder is distended. There is a small stone in the proximal portion of the gallbladder. A second small stone noted at the gallbladder neck (series 2, image 26 and coronal series 5, image 73) with possible obstruction of the gallbladder neck. There is a small pericholecystic fluid. An early acute cholecystitis is not excluded. Clinical correlation is recommended. Repeat ultrasound or further evaluation with HIDA scan may provide better evaluation. Pancreas: Unremarkable. No pancreatic ductal dilatation or surrounding inflammatory changes. Spleen: Normal in size without focal abnormality. Adrenals/Urinary Tract: The adrenal glands are unremarkable. There is a 3 mm non obstructing right renal upper pole calculus. No hydronephrosis. There is no hydronephrosis or nephrolithiasis on the left. There is symmetric enhancement and excretion of contrast by both kidneys. The visualized ureters appear unremarkable. The urinary bladder is collapsed. Stomach/Bowel: The stomach is distended with liquid content. No evidence of gastric outlet obstruction. There is no bowel obstruction or active  inflammation. The appendix is normal. Vascular/Lymphatic: The abdominal aorta and IVC are unremarkable. No portal venous gas. Top-normal lymph  node along the distal esophagus measures 10 mm in short axis. Additional top-normal lymph node at the right cardiophrenic angle noted. No retroperitoneal adenopathy. Reproductive: The uterus is anteverted and grossly unremarkable. Dominant right ovarian follicles/cysts. The left ovary is unremarkable. Other: None Musculoskeletal: Disc desiccation at L5-S1. L1 hemangioma. No acute osseous pathology. IMPRESSION: 1. Cholelithiasis with findings concerning for early acute cholecystitis. Clinical correlation is recommended. Repeat ultrasound or further evaluation with HIDA scan may provide better evaluation. 2. No bowel obstruction or active inflammation. Normal appendix. 3. A 3 mm nonobstructing right renal upper pole calculus. No hydronephrosis. Electronically Signed   By: Elgie CollardArash  Radparvar M.D.   On: 07/05/2019 20:29   Dg Chest Portable 1 View  Result Date: 07/05/2019 CLINICAL DATA:  Chest and abdominal pain EXAM: PORTABLE CHEST 1 VIEW COMPARISON:  Portable exam 1620 hours without priors for comparison FINDINGS: Upper normal heart size. Mediastinal contours and pulmonary vascularity normal. Lungs clear. No pulmonary infiltrate, pleural effusion or pneumothorax. Bones unremarkable. IMPRESSION: No acute abnormalities. Electronically Signed   By: Ulyses SouthwardMark  Boles M.D.   On: 07/05/2019 16:57   [place] Abdomen Limited Ruq  Result Date: 07/05/2019 CLINICAL DATA:  Upper abdominal pain. EXAM: ULTRASOUND ABDOMEN LIMITED RIGHT UPPER QUADRANT COMPARISON:  None. FINDINGS: Gallbladder: Sludge. No gallstones or wall thickening visualized. Trace pericholecystic fluid. Sonographic Eulah PontMurphy sign is indeterminate given recent pain medication administration. There are at least three polyps measuring up to 4 mm. Common bile duct: Diameter: 3 mm, normal. Liver: No focal lesion identified. Within normal  limits in parenchymal echogenicity. Portal vein is patent on color Doppler imaging with normal direction of blood flow towards the liver. IMPRESSION: 1. Gallbladder sludge and trace pericholecystic fluid. No gallstones or wall thickening. Acute cholecystitis is unlikely. 2. Three gallbladder polyps measuring up to 4 mm. No further follow-up required. This recommendation follows ACR consensus guidelines: White Paper of the ACR Incidental Findings Committee II on Gallbladder and Biliary Findings. J Am Coll Radiol 2013:;10:953-956. Electronically Signed   By: Obie DredgeWilliam T Derry M.D.   On: 07/05/2019 19:28    Review of Systems  Constitutional: Negative.   HENT: Negative.   Eyes: Negative.   Respiratory: Negative.   Cardiovascular: Negative.   Gastrointestinal: Positive for abdominal pain, nausea and vomiting.  Genitourinary: Negative.   Musculoskeletal: Negative.   Skin: Negative.   Neurological: Negative.   Endo/Heme/Allergies: Negative.   Psychiatric/Behavioral: Negative.     Blood pressure (!) 125/92, pulse 81, temperature 98.2 F (36.8 C), temperature source Oral, resp. rate 20, height 5\' 2"  (1.575 m), weight 103.9 kg, last menstrual period 06/28/2019, SpO2 100 %, unknown if currently breastfeeding. Physical Exam  Constitutional: She is oriented to person, place, and time. No distress.  Obese female in nad  HENT:  Head: Normocephalic and atraumatic.  Mouth/Throat: No oropharyngeal exudate.  Eyes: Pupils are equal, round, and reactive to light. Conjunctivae and EOM are normal.  Neck: Normal range of motion. Neck supple.  Cardiovascular: Normal rate, regular rhythm and normal heart sounds.  Respiratory: Effort normal and breath sounds normal. No stridor. No respiratory distress.  GI: Soft.  There is mild to moderate RUQ pain  Musculoskeletal: Normal range of motion.        General: No tenderness or edema.  Neurological: She is alert and oriented to person, place, and time. Coordination  normal.  Skin: Skin is warm and dry. No rash noted.  Psychiatric: She has a normal mood and affect. Her behavior is normal. Thought content normal.  Assessment/Plan The pt appears to have symptomatic gallstones. Because of the risk of further pain and possible pancreatitis I think she will benefit from having gallbladder removed. I will admit for bowel rest and hydration. Will discuss timing of surgery with primary team in am  Autumn Messing III, MD 07/05/2019, 9:45 PM

## 2019-07-05 NOTE — ED Triage Notes (Signed)
Pt reports abdominal pain that has gotten worse the last 24 hours. Pt reports she took an oxycodone and it did not touch the pain. Pt reports emesis and states she has had no diarrhea. Pt denies COVID exposure

## 2019-07-05 NOTE — ED Provider Notes (Signed)
[facility]-EMERGENCY DEPT Provider Note   CSN: [id number] Arrival date & time: 07/05/19  1445    History   Chief Complaint Chief Complaint  Patient presents with  . Abdominal Pain  . Emesis    HPI Denise Andrade is a [age] y.o. female.     HPI  43 year old female presents with upper abdominal pain.  Started around 4 AM and has rapidly gotten worse.  The pain is currently severe.  Feels like a really bad stomachache.  Has had multiple episodes of vomiting and cannot keep anything down.  Tried Tylenol and ibuprofen originally and then took some leftover oxycodone from her C-section 1 year ago.  None of this has helped.  Does not radiate.  There is no urinary or vaginal symptoms.  No back pain.  Her chest feels uncomfortable but she is not sure if this is because of how long the pain has been ongoing.  Past Medical History:  Diagnosis Date  . Medical history non-contributory     Patient Active Problem List   Diagnosis Date Noted  . Gallstones 07/05/2019  . Indication for care in labor or delivery 12/20/2017  . S/P primary low transverse C-section 12/20/2017    Past Surgical History:  Procedure Laterality Date  . CESAREAN SECTION N/A 12/20/2017   Procedure: CESAREAN SECTION;  Surgeon:  BingPickens, Charlie, MD;  [facility];  Service: Obstetrics;  Laterality: N/A;  . NO PAST SURGERIES       OB History    Gravida  4   Para  4   Term  4   Preterm      AB      Living  4     SAB      TAB      Ectopic      Multiple  0   Live Births  1            Home Medications    Prior to Admission medications   Medication Sig Start Date End Date Taking? Authorizing Provider  oxyCODONE (OXY IR/ROXICODONE) 5 MG immediate release tablet Take 1 tablet (5 mg total) by mouth every 6 (six) hours as needed for severe pain or breakthrough pain. 12/22/17  Yes Darin EngelsAbraham, Sherin, DO  ferrous sulfate 325 (65 FE) MG tablet Take 1 tablet (325 mg total)  by mouth daily with breakfast. Patient not taking: Reported on 07/05/2019 12/23/17   Pincus LargePhelps, Jazma Y, DO  ibuprofen (ADVIL,MOTRIN) 600 MG tablet Take 1 tablet (600 mg total) by mouth every 6 (six) hours. Patient not taking: Reported on 07/05/2019 12/22/17   Pincus LargePhelps, Jazma Y, DO  senna-docusate (SENOKOT-S) 8.6-50 MG tablet Take 1 tablet by mouth at bedtime. Patient not taking: Reported on 07/05/2019 12/22/17   Pincus LargePhelps, Jazma Y, DO    Family History No family history on file.  Social History Social History   Tobacco Use  . Smoking status: Former Games developermoker  . Smokeless tobacco: Never Used  Substance Use Topics  . Alcohol use: Yes    Frequency: Never    Comment: Social  . Drug use: No     Allergies   Patient has no known allergies.   Review of Systems Review of Systems  Constitutional: Negative for fever.  Respiratory: Negative for shortness of breath.   Cardiovascular: Positive for chest pain.  Gastrointestinal: Positive for abdominal pain, nausea and vomiting. Negative for constipation and diarrhea.  Genitourinary: Negative for dysuria, vaginal bleeding and vaginal discharge.  Musculoskeletal: Negative for  back pain.  All other systems reviewed and are negative.    Physical Exam Updated Vital Signs BP 117/79   Pulse 82   Temp 98.2 F (36.8 C) (Oral)   Resp 16   Ht 5\' 2"  (1.575 m)   Wt 103.9 kg   LMP 06/28/2019   SpO2 100%   BMI 41.88 kg/m   Physical Exam Vitals signs and nursing note reviewed.  Constitutional:      Appearance: She is well-developed. She is obese. She is not diaphoretic.  HENT:     Head: Normocephalic and atraumatic.     Right Ear: External ear normal.     Left Ear: External ear normal.     Nose: Nose normal.  Eyes:     General:        Right eye: No discharge.        Left eye: No discharge.  Cardiovascular:     Rate and Rhythm: Normal rate and regular rhythm.     Heart sounds: Normal heart sounds.  Pulmonary:     Effort: Pulmonary effort is  normal.     Breath sounds: Normal breath sounds.  Abdominal:     Palpations: Abdomen is soft.     Tenderness: There is abdominal tenderness (worst in RUQ) in the right upper quadrant and epigastric area. There is no right CVA tenderness or left CVA tenderness.  Skin:    General: Skin is warm and dry.  Neurological:     Mental Status: She is alert.  Psychiatric:        Mood and Affect: Mood is not anxious.      ED Treatments / Results  Labs (all labs ordered are listed, but only abnormal results are displayed) Labs Reviewed  COMPREHENSIVE METABOLIC PANEL - Abnormal; Notable for the following components:      Result Value   Sodium 132 (*)    Glucose, Bld 117 (*)    Total Protein 8.2 (*)    All other components within normal limits  CBC WITH DIFFERENTIAL/PLATELET - Abnormal; Notable for the following components:   WBC 12.2 (*)    Neutro Abs 11.1 (*)    All other components within normal limits  URINALYSIS, ROUTINE W REFLEX MICROSCOPIC - Abnormal; Notable for the following components:   Hgb urine dipstick SMALL (*)    Ketones, ur 20 (*)    All other components within normal limits  SARS CORONAVIRUS 2 (HOSPITAL ORDER, PERFORMED IN [facility])  LIPASE, BLOOD  I-STAT BETA HCG BLOOD, ED ([facility], [facility], [facility] ONLY)    EKG EKG Interpretation  Date/Time:  Wednesday July 05 2019 17:11:55 EDT Ventricular Rate:  79 PR Interval:    QRS Duration: 94 QT Interval:  397 QTC Calculation: 456 R Axis:   78 Text Interpretation:  Sinus rhythm Low voltage, precordial leads Borderline T abnormalities, anterior leads Baseline wander in lead(s) II aVF No old tracing to compare Confirmed by Pricilla LovelessGoldston, Domanik [place] [phone]) on 07/05/2019 5:44:37 PM   Radiology Ct Abdomen Pelvis W Contrast  Result Date: 07/05/2019 CLINICAL DATA:  43 year old female with left upper quadrant epigastric pain and nausea. EXAM: CT ABDOMEN AND PELVIS WITH CONTRAST TECHNIQUE: Multidetector CT imaging of the abdomen and  pelvis was performed using the standard protocol following bolus administration of intravenous contrast. CONTRAST:  100mL OMNIPAQUE IOHEXOL 300 MG/ML  SOLN COMPARISON:  Abdominal ultrasound dated 07/05/2019 FINDINGS: Lower chest: The visualized lung bases are clear. No intra-abdominal free air or free fluid. Hepatobiliary: The liver is  unremarkable. There is mild intrahepatic biliary ductal dilatation or periportal edema. The gallbladder is distended. There is a small stone in the proximal portion of the gallbladder. A second small stone noted at the gallbladder neck (series 2, image 26 and coronal series 5, image 73) with possible obstruction of the gallbladder neck. There is a small pericholecystic fluid. An early acute cholecystitis is not excluded. Clinical correlation is recommended. Repeat ultrasound or further evaluation with HIDA scan may provide better evaluation. Pancreas: Unremarkable. No pancreatic ductal dilatation or surrounding inflammatory changes. Spleen: Normal in size without focal abnormality. Adrenals/Urinary Tract: The adrenal glands are unremarkable. There is a 3 mm non obstructing right renal upper pole calculus. No hydronephrosis. There is no hydronephrosis or nephrolithiasis on the left. There is symmetric enhancement and excretion of contrast by both kidneys. The visualized ureters appear unremarkable. The urinary bladder is collapsed. Stomach/Bowel: The stomach is distended with liquid content. No evidence of gastric outlet obstruction. There is no bowel obstruction or active inflammation. The appendix is normal. Vascular/Lymphatic: The abdominal aorta and IVC are unremarkable. No portal venous gas. Top-normal lymph node along the distal esophagus measures 10 mm in short axis. Additional top-normal lymph node at the right cardiophrenic angle noted. No retroperitoneal adenopathy. Reproductive: The uterus is anteverted and grossly unremarkable. Dominant right ovarian follicles/cysts. The  left ovary is unremarkable. Other: None Musculoskeletal: Disc desiccation at L5-S1. L1 hemangioma. No acute osseous pathology. IMPRESSION: 1. Cholelithiasis with findings concerning for early acute cholecystitis. Clinical correlation is recommended. Repeat ultrasound or further evaluation with HIDA scan may provide better evaluation. 2. No bowel obstruction or active inflammation. Normal appendix. 3. A 3 mm nonobstructing right renal upper pole calculus. No hydronephrosis. Electronically Signed   By: Elgie CollardArash  Radparvar M.D.   On: 07/05/2019 20:29   Dg Chest Portable 1 View  Result Date: 07/05/2019 CLINICAL DATA:  Chest and abdominal pain EXAM: PORTABLE CHEST 1 VIEW COMPARISON:  Portable exam 1620 hours without priors for comparison FINDINGS: Upper normal heart size. Mediastinal contours and pulmonary vascularity normal. Lungs clear. No pulmonary infiltrate, pleural effusion or pneumothorax. Bones unremarkable. IMPRESSION: No acute abnormalities. Electronically Signed   By: Ulyses SouthwardMark  Boles M.D.   On: 07/05/2019 16:57   [place] Abdomen Limited Ruq  Result Date: 07/05/2019 CLINICAL DATA:  Upper abdominal pain. EXAM: ULTRASOUND ABDOMEN LIMITED RIGHT UPPER QUADRANT COMPARISON:  None. FINDINGS: Gallbladder: Sludge. No gallstones or wall thickening visualized. Trace pericholecystic fluid. Sonographic Eulah PontMurphy sign is indeterminate given recent pain medication administration. There are at least three polyps measuring up to 4 mm. Common bile duct: Diameter: 3 mm, normal. Liver: No focal lesion identified. Within normal limits in parenchymal echogenicity. Portal vein is patent on color Doppler imaging with normal direction of blood flow towards the liver. IMPRESSION: 1. Gallbladder sludge and trace pericholecystic fluid. No gallstones or wall thickening. Acute cholecystitis is unlikely. 2. Three gallbladder polyps measuring up to 4 mm. No further follow-up required. This recommendation follows ACR consensus guidelines: White Paper  of the ACR Incidental Findings Committee II on Gallbladder and Biliary Findings. J Am Coll Radiol 2013:;10:953-956. Electronically Signed   By: Obie DredgeWilliam T Derry M.D.   On: 07/05/2019 19:28    Procedures Procedures (including critical care time)  Medications Ordered in ED Medications  sodium chloride (PF) 0.9 % injection (has no administration in time range)  ondansetron (ZOFRAN) injection 4 mg (4 mg Intravenous Given 07/05/19 1719)  HYDROmorphone (DILAUDID) injection 1 mg (1 mg Intravenous Given 07/05/19 1720)  sodium chloride 0.9 %  bolus 1,000 mL (0 mLs Intravenous Stopped 07/05/19 1848)  HYDROmorphone (DILAUDID) injection 0.5 mg (0.5 mg Intravenous Given 07/05/19 2045)  iohexol (OMNIPAQUE) 300 MG/ML solution 100 mL (100 mLs Intravenous Contrast Given 07/05/19 1950)  cefTRIAXone (ROCEPHIN) 2 g in sodium chloride 0.9 % 100 mL IVPB (2 g Intravenous New Bag/Given 07/05/19 2212)     Initial Impression / Assessment and Plan / ED Course  I have reviewed the triage vital signs and the nursing notes.  Pertinent labs & imaging results that were available during my care of the patient were reviewed by me and considered in my medical decision making (see chart for details).        Right upper quadrant ultrasound is somewhat equivocal but not definitive for cholecystitis and given her abdominal pain and vomiting a CT was obtained.  This shows more concern for cholecystitis.  She was given IV Rocephin.  I discussed with Dr. Marlou Starks and general surgery will admit.  Denise Andrade was evaluated in Emergency Department on 07/05/2019 for the symptoms described in the history of present illness. She was evaluated in the context of the global COVID-19 pandemic, which necessitated consideration that the patient might be at risk for infection with the SARS-CoV-2 virus that causes COVID-19. Institutional protocols and algorithms that pertain to the evaluation of patients at risk for COVID-19 are in a state of rapid  change based on information released by regulatory bodies including the CDC and federal and state organizations. These policies and algorithms were followed during the patient's care in the ED.   Final Clinical Impressions(s) / ED Diagnoses   Final diagnoses:  Upper abdominal pain  Acute cholecystitis    ED Discharge Orders    None       Sherwood Gambler, MD 07/05/19 2249

## 2019-07-06 ENCOUNTER — Encounter (HOSPITAL_COMMUNITY)
Admission: EM | Disposition: A | Discharge status: Home / Self Care | Payer: Self-pay | Source: Home / Self Care | Attending: Emergency Medicine

## 2019-07-06 ENCOUNTER — Observation Stay (HOSPITAL_COMMUNITY): Payer: 59 | Admitting: Certified Registered Nurse Anesthetist

## 2019-07-06 ENCOUNTER — Encounter (HOSPITAL_COMMUNITY): Payer: Self-pay | Admitting: *Deleted

## 2019-07-06 HISTORY — PX: CHOLECYSTECTOMY: SHX55

## 2019-07-06 LAB — SURGICAL PCR SCREEN
MRSA, PCR: NEGATIVE
Staphylococcus aureus: NEGATIVE

## 2019-07-06 SURGERY — LAPAROSCOPIC CHOLECYSTECTOMY WITH INTRAOPERATIVE CHOLANGIOGRAM
Anesthesia: General

## 2019-07-06 MED ORDER — PROPOFOL 10 MG/ML IV BOLUS
INTRAVENOUS | Status: AC
  Filled 2019-07-06: qty 20

## 2019-07-06 MED ORDER — DEXAMETHASONE SODIUM PHOSPHATE 10 MG/ML IJ SOLN
INTRAMUSCULAR | Status: AC
  Filled 2019-07-06: qty 1

## 2019-07-06 MED ORDER — OXYCODONE HCL 5 MG PO TABS
5.0000 mg | ORAL_TABLET | Freq: Four times a day (QID) | ORAL | Status: DC | PRN
  Administered 2019-07-06 – 2019-07-07 (×3): 5 mg via ORAL
  Filled 2019-07-06 (×3): qty 1

## 2019-07-06 MED ORDER — STERILE WATER FOR IRRIGATION IR SOLN
Status: DC | PRN
  Administered 2019-07-06: 1000 mL

## 2019-07-06 MED ORDER — FENTANYL CITRATE (PF) 100 MCG/2ML IJ SOLN
INTRAMUSCULAR | Status: DC | PRN
  Administered 2019-07-06 (×7): 50 ug via INTRAVENOUS

## 2019-07-06 MED ORDER — LACTATED RINGERS IV SOLN
INTRAVENOUS | Status: DC | PRN
  Administered 2019-07-06: 11:00:00 via INTRAVENOUS

## 2019-07-06 MED ORDER — OXYCODONE HCL 5 MG PO TABS: 5.0000 mg | ORAL_TABLET | Freq: Once | ORAL | Status: DC | PRN

## 2019-07-06 MED ORDER — ONDANSETRON HCL 4 MG/2ML IJ SOLN
INTRAMUSCULAR | Status: AC
  Filled 2019-07-06: qty 2

## 2019-07-06 MED ORDER — PANTOPRAZOLE SODIUM 40 MG IV SOLR
40.0000 mg | Freq: Every day | INTRAVENOUS | Status: DC
  Administered 2019-07-06: 01:00:00 40 mg via INTRAVENOUS
  Filled 2019-07-06: qty 40

## 2019-07-06 MED ORDER — LIDOCAINE HCL (PF) 1 % IJ SOLN
INTRAMUSCULAR | Status: DC | PRN
  Administered 2019-07-06: 5 mL

## 2019-07-06 MED ORDER — PROPOFOL 10 MG/ML IV BOLUS
INTRAVENOUS | Status: DC | PRN
  Administered 2019-07-06: 150 mg via INTRAVENOUS

## 2019-07-06 MED ORDER — ONDANSETRON HCL 4 MG/2ML IJ SOLN
4.0000 mg | Freq: Four times a day (QID) | INTRAMUSCULAR | Status: DC | PRN
  Administered 2019-07-06: 4 mg via INTRAVENOUS
  Filled 2019-07-06: qty 2

## 2019-07-06 MED ORDER — HYDROMORPHONE HCL 1 MG/ML IJ SOLN
INTRAMUSCULAR | Status: AC
  Administered 2019-07-06: 0.25 mg via INTRAVENOUS
  Filled 2019-07-06: qty 1

## 2019-07-06 MED ORDER — METRONIDAZOLE IN NACL 5-0.79 MG/ML-% IV SOLN
500.0000 mg | Freq: Once | INTRAVENOUS | Status: AC
  Administered 2019-07-06: 500 mg via INTRAVENOUS
  Filled 2019-07-06: qty 100

## 2019-07-06 MED ORDER — SCOPOLAMINE 1 MG/3DAYS TD PT72
MEDICATED_PATCH | TRANSDERMAL | Status: AC
  Filled 2019-07-06: qty 1

## 2019-07-06 MED ORDER — HYDROMORPHONE HCL 1 MG/ML IJ SOLN: 0.5000 mg | INTRAMUSCULAR | Status: DC | PRN

## 2019-07-06 MED ORDER — MIDAZOLAM HCL 2 MG/2ML IJ SOLN
INTRAMUSCULAR | Status: AC
  Filled 2019-07-06: qty 2

## 2019-07-06 MED ORDER — LIDOCAINE 2% (20 MG/ML) 5 ML SYRINGE
INTRAMUSCULAR | Status: DC | PRN
  Administered 2019-07-06: 80 mg via INTRAVENOUS

## 2019-07-06 MED ORDER — ROCURONIUM BROMIDE 10 MG/ML (PF) SYRINGE
PREFILLED_SYRINGE | INTRAVENOUS | Status: AC
  Filled 2019-07-06: qty 10

## 2019-07-06 MED ORDER — KCL IN DEXTROSE-NACL 20-5-0.9 MEQ/L-%-% IV SOLN
INTRAVENOUS | Status: DC
  Administered 2019-07-06: 01:00:00 via INTRAVENOUS
  Filled 2019-07-06 (×2): qty 1000

## 2019-07-06 MED ORDER — MIDAZOLAM HCL 5 MG/5ML IJ SOLN
INTRAMUSCULAR | Status: DC | PRN
  Administered 2019-07-06: 2 mg via INTRAVENOUS

## 2019-07-06 MED ORDER — HYDROMORPHONE HCL 1 MG/ML IJ SOLN
INTRAMUSCULAR | Status: AC
  Administered 2019-07-06: 0.5 mg via INTRAVENOUS
  Filled 2019-07-06: qty 1

## 2019-07-06 MED ORDER — ONDANSETRON HCL 4 MG/2ML IJ SOLN
INTRAMUSCULAR | Status: DC | PRN
  Administered 2019-07-06: 4 mg via INTRAVENOUS

## 2019-07-06 MED ORDER — BUPIVACAINE-EPINEPHRINE (PF) 0.25% -1:200000 IJ SOLN
INTRAMUSCULAR | Status: DC | PRN
  Administered 2019-07-06: 5 mL via PERINEURAL

## 2019-07-06 MED ORDER — HYDROMORPHONE HCL 1 MG/ML IJ SOLN
0.2500 mg | INTRAMUSCULAR | Status: DC | PRN
  Administered 2019-07-06: 0.5 mg via INTRAVENOUS
  Administered 2019-07-06: 0.25 mg via INTRAVENOUS
  Administered 2019-07-06: 0.025 mg via INTRAVENOUS
  Administered 2019-07-06 (×2): 0.5 mg via INTRAVENOUS

## 2019-07-06 MED ORDER — MEPERIDINE HCL 50 MG/ML IJ SOLN: 6.2500 mg | INTRAMUSCULAR | Status: DC | PRN

## 2019-07-06 MED ORDER — SCOPOLAMINE 1 MG/3DAYS TD PT72
MEDICATED_PATCH | TRANSDERMAL | Status: DC | PRN
  Administered 2019-07-06: 1 via TRANSDERMAL

## 2019-07-06 MED ORDER — ROCURONIUM BROMIDE 50 MG/5ML IV SOSY
PREFILLED_SYRINGE | INTRAVENOUS | Status: DC | PRN
  Administered 2019-07-06 (×2): 10 mg via INTRAVENOUS
  Administered 2019-07-06: 50 mg via INTRAVENOUS

## 2019-07-06 MED ORDER — PROMETHAZINE HCL 25 MG/ML IJ SOLN: 6.2500 mg | INTRAMUSCULAR | Status: DC | PRN

## 2019-07-06 MED ORDER — LACTATED RINGERS IR SOLN
Status: DC | PRN
  Administered 2019-07-06: 1

## 2019-07-06 MED ORDER — ONDANSETRON 4 MG PO TBDP: 4.0000 mg | ORAL_TABLET | Freq: Four times a day (QID) | ORAL | Status: DC | PRN

## 2019-07-06 MED ORDER — SODIUM CHLORIDE 0.9 % IV SOLN
2.0000 g | INTRAVENOUS | Status: DC
  Filled 2019-07-06: qty 20

## 2019-07-06 MED ORDER — METRONIDAZOLE IN NACL 5-0.79 MG/ML-% IV SOLN
INTRAVENOUS | Status: AC
  Filled 2019-07-06: qty 100

## 2019-07-06 MED ORDER — SUCCINYLCHOLINE CHLORIDE 200 MG/10ML IV SOSY
PREFILLED_SYRINGE | INTRAVENOUS | Status: AC
  Filled 2019-07-06: qty 10

## 2019-07-06 MED ORDER — DEXAMETHASONE SODIUM PHOSPHATE 10 MG/ML IJ SOLN
INTRAMUSCULAR | Status: DC | PRN
  Administered 2019-07-06: 10 mg via INTRAVENOUS

## 2019-07-06 MED ORDER — MORPHINE SULFATE (PF) 2 MG/ML IV SOLN
1.0000 mg | INTRAVENOUS | Status: DC | PRN
  Administered 2019-07-06: 01:00:00 2 mg via INTRAVENOUS
  Filled 2019-07-06: qty 1

## 2019-07-06 MED ORDER — 0.9 % SODIUM CHLORIDE (POUR BTL) OPTIME
TOPICAL | Status: DC | PRN
  Administered 2019-07-06: 1000 mL

## 2019-07-06 MED ORDER — KETAMINE HCL 10 MG/ML IJ SOLN
INTRAMUSCULAR | Status: DC | PRN
  Administered 2019-07-06: 30 mg via INTRAVENOUS

## 2019-07-06 MED ORDER — SUGAMMADEX SODIUM 200 MG/2ML IV SOLN
INTRAVENOUS | Status: DC | PRN
  Administered 2019-07-06: 200 mg via INTRAVENOUS

## 2019-07-06 MED ORDER — FENTANYL CITRATE (PF) 250 MCG/5ML IJ SOLN
INTRAMUSCULAR | Status: AC
  Filled 2019-07-06: qty 5

## 2019-07-06 MED ORDER — SUCCINYLCHOLINE CHLORIDE 200 MG/10ML IV SOSY
PREFILLED_SYRINGE | INTRAVENOUS | Status: DC | PRN
  Administered 2019-07-06: 120 mg via INTRAVENOUS

## 2019-07-06 MED ORDER — LIDOCAINE HCL (PF) 1 % IJ SOLN
INTRAMUSCULAR | Status: AC
  Filled 2019-07-06: qty 30

## 2019-07-06 MED ORDER — FENTANYL CITRATE (PF) 100 MCG/2ML IJ SOLN
INTRAMUSCULAR | Status: AC
  Filled 2019-07-06: qty 2

## 2019-07-06 MED ORDER — LIDOCAINE 2% (20 MG/ML) 5 ML SYRINGE
INTRAMUSCULAR | Status: AC
  Filled 2019-07-06: qty 5

## 2019-07-06 MED ORDER — OXYCODONE HCL 5 MG/5ML PO SOLN: 5.0000 mg | Freq: Once | ORAL | Status: DC | PRN

## 2019-07-06 SURGICAL SUPPLY — 42 items
APPLIER CLIP ROT 10 11.4 M/L (STAPLE) ×3
CABLE HIGH FREQUENCY MONO STRZ (ELECTRODE) ×3 IMPLANT
CHLORAPREP W/TINT 26 (MISCELLANEOUS) ×3 IMPLANT
CLIP APPLIE ROT 10 11.4 M/L (STAPLE) ×1 IMPLANT
CLIP VESOLOCK MED LG 6/CT (CLIP) IMPLANT
COVER MAYO STAND STRL (DRAPES) IMPLANT
COVER SURGICAL LIGHT HANDLE (MISCELLANEOUS) ×3 IMPLANT
COVER WAND RF STERILE (DRAPES) IMPLANT
DECANTER SPIKE VIAL GLASS SM (MISCELLANEOUS) ×3 IMPLANT
DERMABOND ADVANCED (GAUZE/BANDAGES/DRESSINGS) ×2
DERMABOND ADVANCED .7 DNX12 (GAUZE/BANDAGES/DRESSINGS) ×1 IMPLANT
DRAPE C-ARM 42X120 X-RAY (DRAPES) IMPLANT
ELECT L-HOOK LAP 45CM DISP (ELECTROSURGICAL)
ELECT PENCIL ROCKER SW 15FT (MISCELLANEOUS) ×3 IMPLANT
ELECT REM PT RETURN 15FT ADLT (MISCELLANEOUS) ×3 IMPLANT
ELECTRODE L-HOOK LAP 45CM DISP (ELECTROSURGICAL) IMPLANT
GLOVE BIO SURGEON STRL SZ 6 (GLOVE) ×3 IMPLANT
GLOVE INDICATOR 6.5 STRL GRN (GLOVE) ×3 IMPLANT
GOWN STRL REUS W/TWL 2XL LVL3 (GOWN DISPOSABLE) ×3 IMPLANT
GOWN STRL REUS W/TWL XL LVL3 (GOWN DISPOSABLE) ×6 IMPLANT
HEMOSTAT SNOW SURGICEL 2X4 (HEMOSTASIS) ×3 IMPLANT
KIT BASIN OR (CUSTOM PROCEDURE TRAY) ×3 IMPLANT
KIT TURNOVER KIT A (KITS) IMPLANT
L-HOOK LAP DISP 36CM (ELECTROSURGICAL) ×3
LHOOK LAP DISP 36CM (ELECTROSURGICAL) ×1 IMPLANT
POUCH RETRIEVAL ECOSAC 10 (ENDOMECHANICALS) ×1 IMPLANT
POUCH RETRIEVAL ECOSAC 10MM (ENDOMECHANICALS) ×2
POUCH SPECIMEN RETRIEVAL 10MM (ENDOMECHANICALS) ×3 IMPLANT
PROTECTOR NERVE ULNAR (MISCELLANEOUS) IMPLANT
SCISSORS LAP 5X35 DISP (ENDOMECHANICALS) ×3 IMPLANT
SET CHOLANGIOGRAPH MIX (MISCELLANEOUS) IMPLANT
SET IRRIG TUBING LAPAROSCOPIC (IRRIGATION / IRRIGATOR) ×3 IMPLANT
SET TUBE SMOKE EVAC HIGH FLOW (TUBING) IMPLANT
SLEEVE XCEL OPT CAN 5 100 (ENDOMECHANICALS) ×3 IMPLANT
SUT MNCRL AB 4-0 PS2 18 (SUTURE) ×3 IMPLANT
TAPE CLOTH 4X10 WHT NS (GAUZE/BANDAGES/DRESSINGS) IMPLANT
TOWEL OR 17X26 10 PK STRL BLUE (TOWEL DISPOSABLE) ×3 IMPLANT
TOWEL OR NON WOVEN STRL DISP B (DISPOSABLE) ×3 IMPLANT
TRAY LAPAROSCOPIC (CUSTOM PROCEDURE TRAY) ×3 IMPLANT
TROCAR BLADELESS OPT 5 100 (ENDOMECHANICALS) ×3 IMPLANT
TROCAR XCEL BLUNT TIP 100MML (ENDOMECHANICALS) ×3 IMPLANT
TROCAR XCEL NON-BLD 11X100MML (ENDOMECHANICALS) ×3 IMPLANT

## 2019-07-06 NOTE — Transfer of Care (Signed)
Immediate Anesthesia Transfer of Care Note  Patient: Denise Andrade  Procedure(s) Performed: LAPAROSCOPIC CHOLECYSTECTOMY (N/A )  Patient Location: PACU  Anesthesia Type:General  Level of Consciousness: awake, alert  and oriented  Airway & Oxygen Therapy: Patient Spontanous Breathing and Patient connected to face mask oxygen  Post-op Assessment: Report given to RN and Post -op Vital signs reviewed and stable  Post vital signs: Reviewed and stable  Last Vitals:  Vitals Value Taken Time  BP 121/78 07/06/19 1310  Temp    Pulse 78 07/06/19 1312  Resp 13 07/06/19 1312  SpO2 100 % 07/06/19 1312  Vitals shown include unvalidated device data.  Last Pain:  Vitals:   07/06/19 1027  TempSrc: Oral  PainSc:       Patients Stated Pain Goal: 2 (39/03/00 9233)  Complications: No apparent anesthesia complications

## 2019-07-06 NOTE — Discharge Instructions (Addendum)
[facility]   LAPAROSCOPIC SURGERY: POST OP INSTRUCTIONS Always review your discharge instruction sheet given to you by the facility where your surgery was performed. IF YOU HAVE DISABILITY OR FAMILY LEAVE FORMS, YOU MUST BRING THEM TO THE OFFICE FOR PROCESSING.   DO NOT GIVE THEM TO YOUR DOCTOR.  PAIN CONTROL  1. First take acetaminophen (Tylenol) AND/or ibuprofen (Advil) to control your pain after surgery.  Follow directions on package.  Taking acetaminophen (Tylenol) and/or ibuprofen (Advil) regularly after surgery will help to control your pain and lower the amount of prescription pain medication you may need.  You should not take more than 4,000 mg (4 grams) of acetaminophen (Tylenol) in 24 hours.  You should not take ibuprofen (Advil), aleve, motrin, naprosyn or other NSAIDS if you have a history of stomach ulcers or chronic kidney disease.  2. A prescription for pain medication may be given to you upon discharge.  Take your pain medication as prescribed, if you still have uncontrolled pain after taking acetaminophen (Tylenol) or ibuprofen (Advil). 3. Use ice packs to help control pain. 4. If you need a refill on your pain medication, please contact your pharmacy.  They will contact our office to request authorization. Prescriptions will not be filled after 5pm or on week-ends.  HOME MEDICATIONS 5. Take your usually prescribed medications unless otherwise directed.  DIET 6. You should follow a light diet the first few days after arrival home.  Be sure to include lots of fluids daily. Avoid fatty, fried foods.   CONSTIPATION 7. It is common to experience some constipation after surgery and if you are taking pain medication.  Increasing fluid intake and taking a stool softener (such as Colace) will usually help or prevent this problem from occurring.  A mild laxative (Milk of Magnesia or Miralax) should be taken according to package instructions if there are no bowel  movements after 48 hours.  WOUND/INCISION CARE 8. Most patients will experience some swelling and bruising in the area of the incisions.  Ice packs will help.  Swelling and bruising can take several days to resolve.  9. Unless discharge instructions indicate otherwise, follow guidelines below  a. STERI-STRIPS - you may remove your outer bandages 48 hours after surgery, and you may shower at that time.  You have steri-strips (small skin tapes) in place directly over the incision.  These strips should be left on the skin for 7-10 days.   b. DERMABOND/SKIN GLUE - you may shower in 24 hours.  The glue will flake off over the next 2-3 weeks. 10. Any sutures or staples will be removed at the office during your follow-up visit.  ACTIVITIES 11. You may resume regular (light) daily activities beginning the next day--such as daily self-care, walking, climbing stairs--gradually increasing activities as tolerated.  You may have sexual intercourse when it is comfortable.  Refrain from any heavy lifting or straining until approved by your doctor. a. You may drive when you are no longer taking prescription pain medication, you can comfortably wear a seatbelt, and you can safely maneuver your car and apply brakes.  FOLLOW-UP 12. You should see your doctor in the office for a follow-up appointment approximately 2-3 weeks after your surgery.  You should have been given your post-op/follow-up appointment when your surgery was scheduled.  If you did not receive a post-op/follow-up appointment, make sure that you call for this appointment within a day or two after you arrive home to insure a convenient appointment time.  OTHER INSTRUCTIONS  WHEN TO CALL YOUR DOCTOR: 1. Fever over 101.0 2. Inability to urinate 3. Continued bleeding from incision. 4. Increased pain, redness, or drainage from the incision. 5. Increasing abdominal pain  The clinic staff is available to answer your questions during regular business  hours.  Please dont hesitate to call and ask to speak to one of the nurses for clinical concerns.  If you have a medical emergency, go to the nearest emergency room or call 911.  A surgeon from [facility] is always on call at the hospital. [address], [address], [place], [place]  [id number] ? [address] [address], [place], [place]   [id number] [phone] ? [phone] ? FAX [phone]    Gallbladder Eating Plan If you have a gallbladder condition, you may have trouble digesting fats. Eating a low-fat diet can help reduce your symptoms, and may be helpful before and after having surgery to remove your gallbladder (cholecystectomy). Your health care provider may recommend that you work with a diet and nutrition specialist (dietitian) to help you reduce the amount of fat in your diet. What are tips for following this plan? General guidelines  Limit your fat intake to less than 30% of your total daily calories. If you eat around 1,800 calories each day, this is less than 60 grams (g) of fat per day.  Fat is an important part of a healthy diet. Eating a low-fat diet can make it hard to maintain a healthy body weight. Ask your dietitian how much fat, calories, and other nutrients you need each day.  Eat small, frequent meals throughout the day instead of three large meals.  Drink at least 8-10 cups of fluid a day. Drink enough fluid to keep your urine clear or pale yellow.  Limit alcohol intake to no more than 1 drink a day for nonpregnant women and 2 drinks a day for men. One drink equals 12 oz of beer, 5 oz of wine, or 1 oz of hard liquor. Reading food labels  Check Nutrition Facts on food labels for the amount of fat per serving. Choose foods with less than 3 grams of fat per serving. Shopping  Choose nonfat and low-fat healthy foods. Look for the words nonfat, low fat, or fat free.  Avoid buying processed or prepackaged foods. Cooking  Cook using low-fat  methods, such as baking, broiling, grilling, or boiling.  Cook with small amounts of healthy fats, such as olive oil, grapeseed oil, canola oil, or sunflower oil. What foods are recommended?   All fresh, frozen, or canned fruits and vegetables.  Whole grains.  Low-fat or non-fat (skim) milk and yogurt.  Lean meat, skinless poultry, fish, eggs, and beans.  Low-fat protein supplement powders or drinks.  Spices and herbs. What foods are not recommended?  High-fat foods. These include baked goods, fast food, fatty cuts of meat, ice cream, french toast, sweet rolls, pizza, cheese bread, foods covered with butter, creamy sauces, or cheese.  Fried foods. These include french fries, tempura, battered fish, breaded chicken, fried breads, and sweets.  Foods with strong odors.  Foods that cause bloating and gas. Summary  A low-fat diet can be helpful if you have a gallbladder condition, or before and after gallbladder surgery.  Limit your fat intake to less than 30% of your total daily calories. This is about 60 g of fat if you eat 1,800 calories each day.  Eat small, frequent meals throughout the day instead of three large meals.  This information is not intended to replace advice given to you by your health care provider. Make sure you discuss any questions you have with your health care provider. Document Released: 12/05/2013 Document Revised: 03/23/2019 Document Reviewed: 01/07/2017 Elsevier Patient Education  2020 [employer].

## 2019-07-06 NOTE — Progress Notes (Signed)
Day of Surgery    CC:  Subjective: Feeling a little better this morning. She reports only a mild, dull soreness in her RUQ. Denies any nausea or vomiting. Feels hungry.  Works in [employer] and does have to do some lifting.  Objective: Vital signs in last 24 hours: Temp:  [98.2 F (36.8 C)-98.9 F (37.2 C)] 98.9 F (37.2 C) (07/23 0854) Pulse Rate:  [75-99] 91 (07/23 0854) Resp:  [13-20] 18 (07/23 0854) BP: (101-131)/(67-105) 101/67 (07/23 0854) SpO2:  [98 %-100 %] 98 % (07/23 0854) Weight:  [103.9 kg] 103.9 kg (07/22 1521) Last BM Date: 07/05/19 N.p.o. 1500 IV 200 urine recorded Afebrile vital signs are stable No labs this a.m. labs last evening shows a WBC of 12.2 LFTs are normal. Intake/Output from previous day: 07/22 0701 - 07/23 0700 In: 1534.7 [I.V.:534.7; IV Piggyback:1000] Out: 200 [Urine:200] Intake/Output this shift: Total I/O In: 200 [I.V.:200] Out: 500 [Urine:500]  Physical exam: Gen:  Alert, NAD, pleasant HEENT: EOM's intact, pupils equal and round Card:  RRR Pulm:  CTAB, no W/R/R, effort normal Abd: Soft, ND, mild RUQ TTP without rebound or guarding, no HSM, no hernia Ext: no BLE edema Psych: A&Ox3  Skin: no rashes noted, warm and dry  Lab Results:  Recent Labs    07/05/19 1723  WBC 12.2*  HGB 13.7  HCT 41.3  PLT 251    BMET Recent Labs    07/05/19 1723  NA 132*  K 3.6  CL 103  CO2 23  GLUCOSE 117*  BUN 11  CREATININE 0.61  CALCIUM 9.1   PT/INR No results for input(s): LABPROT, INR in the last 72 hours.  Recent Labs  Lab 07/05/19 1723  AST 31  ALT 43  ALKPHOS 89  BILITOT 0.6  PROT 8.2*  ALBUMIN 4.1     Lipase     Component Value Date/Time   LIPASE 34 07/05/2019 1723     Prior to Admission medications   Medication Sig Start Date End Date Taking? Authorizing Provider  oxyCODONE (OXY IR/ROXICODONE) 5 MG immediate release tablet Take 1 tablet (5 mg total) by mouth every 6 (six) hours as needed for severe pain or  breakthrough pain. 12/22/17  Yes Darin EngelsAbraham, Sherin, DO  ferrous sulfate 325 (65 FE) MG tablet Take 1 tablet (325 mg total) by mouth daily with breakfast. Patient not taking: Reported on 07/05/2019 12/23/17   Pincus LargePhelps, Jazma Y, DO  ibuprofen (ADVIL,MOTRIN) 600 MG tablet Take 1 tablet (600 mg total) by mouth every 6 (six) hours. Patient not taking: Reported on 07/05/2019 12/22/17   Pincus LargePhelps, Jazma Y, DO  senna-docusate (SENOKOT-S) 8.6-50 MG tablet Take 1 tablet by mouth at bedtime. Patient not taking: Reported on 07/05/2019 12/22/17   Pincus LargePhelps, Jazma Y, DO    Medications: . pantoprazole (PROTONIX) IV  40 mg Intravenous QHS   . cefTRIAXone (ROCEPHIN)  IV    . dextrose 5 % and 0.9 % NaCl with KCl 20 mEq/L 100 mL/hr at 07/06/19 0600  Assessment/Plan COVID negative Hx C-section 12/20/2017  Symptomatic cholelithiasis/cholecystitis  - To OR today for laparoscopic cholecystectomy. Keep NPO. She received IV rocephin. Patient hopes to go home later today after surgery.  She requests that Dr. Donell BeersByerly call her mother in law (phone number below) after surgery.  FEN: IV fluids/n.p.o. ID: Rocephin 7/22 >> day 1(next dose 2200 hrs) DVT: SCDs Follow-up: [facility] POC: Aundra Dubinmccray, rosemary Mother   [phone]  Wyatt HasteGraham,Mark Spouse [phone]       LOS: 0 days  [place], [facility] 07/06/2019, 10:21 AM Pager: [phone] Mon 7:00 am -11:30 AM Tues-Fri 7:00 am-4:30 pm Sat-Sun 7:00 am-11:30 am

## 2019-07-06 NOTE — Op Note (Signed)
Laparoscopic Cholecystectomy  Indications: This patient presents with acute cholecystitis and will undergo laparoscopic cholecystectomy.  Pre-operative Diagnosis: acute cholecystitis  Post-operative Diagnosis: Same  Surgeon: Stark Klein   Assistants: Will Creig Hines, PA-C  Anesthesia: General endotracheal anesthesia and local  ASA Class: 3  Procedure Details  The patient was seen again in the Holding Room. The risks, benefits, complications, treatment options, and expected outcomes were discussed with the patient. The possibilities of  bleeding, recurrent infection, damage to nearby structures, the need for additional procedures, failure to diagnose a condition, the possible need to convert to an open procedure, and creating a complication requiring transfusion or operation were discussed with the patient. The likelihood of improving the patient's symptoms with return to their baseline status is good.    The patient and/or family concurred with the proposed plan, giving informed consent. The site of surgery properly noted. The patient was taken to Operating Room, and the procedure verified as Laparoscopic Cholecystectomy with possible Intraoperative Cholangiogram. A Time Out was held and the above information confirmed.  Prior to the induction of general anesthesia, antibiotic prophylaxis was administered. General endotracheal anesthesia was then administered and tolerated well. After the induction, the abdomen was prepped with Chloraprep and draped in the sterile fashion. The patient was positioned in the supine position.  Local anesthetic agent was injected into the skin near the umbilicus and an incision made. We dissected down to the abdominal fascia with blunt dissection.  The fascia was incised vertically and we entered the peritoneal cavity bluntly.  A pursestring suture of 0-Vicryl was placed around the fascial opening.  The Hasson cannula was inserted and secured with the stay suture.   Pneumoperitoneum was then created with CO2 and tolerated well without any adverse changes in the patient's vital signs. An 11-mm port was placed in the subxiphoid position.  Two 5-mm ports were placed in the right upper quadrant. All skin incisions were infiltrated with a local anesthetic agent before making the incision and placing the trocars.   We positioned the patient in reverse Trendelenburg, tilted slightly to the patient's left.  The gallbladder was identified, the fundus grasped and retracted cephalad. Adhesions were lysed bluntly and with the electrocautery where indicated, taking care not to injure any adjacent organs or viscus. The infundibulum was grasped and retracted laterally, exposing the peritoneum overlying the triangle of Calot. This was then divided and exposed in a blunt fashion. The cystic duct was clearly identified and bluntly dissected circumferentially.  A critical view of the cystic duct and cystic artery was obtained.  The cystic duct was then ligated with clips and divided. The cystic artery was identified, dissected free, ligated with clips and divided as well.   The gallbladder was dissected from the liver bed in retrograde fashion with the electrocautery. The gallbladder was removed and placed in an Endocatch bag.  The gallbladder and Endocatch bag were then removed through the umbilical port site.  The liver bed was irrigated and inspected. Hemostasis was achieved with the electrocautery. Copious irrigation was utilized and was repeatedly aspirated until clear.    We again inspected the right upper quadrant for hemostasis.  Pneumoperitoneum was released as we removed the trocars.   The pursestring suture was used to close the umbilical fascia.  4-0 Monocryl was used to close the skin.   The skin was cleaned and dry, and Dermabond was applied. The patient was then extubated and brought to the recovery room in stable condition. Instrument, sponge, and needle counts were  correct  at closure and at the conclusion of the case.   Findings: Acute inflammation.  Normal size cystic duct.    Estimated Blood Loss: <50 mL         Drains: none          Specimens: Gallbladder to pathology       Complications: None; patient tolerated the procedure well.         Disposition: PACU - hemodynamically stable.         Condition: stable

## 2019-07-06 NOTE — Anesthesia Preprocedure Evaluation (Signed)
Anesthesia Evaluation  Patient identified by MRN, date of birth, ID band Patient awake    Reviewed: Allergy & Precautions, NPO status , Patient's Chart, lab work & pertinent test results  Airway Mallampati: II  TM Distance: >3 FB Neck ROM: Full    Dental no notable dental hx.    Pulmonary neg pulmonary ROS, former smoker,    Pulmonary exam normal breath sounds clear to auscultation       Cardiovascular negative cardio ROS Normal cardiovascular exam Rhythm:Regular Rate:Normal     Neuro/Psych negative neurological ROS  negative psych ROS   GI/Hepatic negative GI ROS, Neg liver ROS,   Endo/Other  Morbid obesity  Renal/GU negative Renal ROS  negative genitourinary   Musculoskeletal negative musculoskeletal ROS (+)   Abdominal (+) + obese,   Peds negative pediatric ROS (+)  Hematology negative hematology ROS (+)   Anesthesia Other Findings   Reproductive/Obstetrics negative OB ROS                             Anesthesia Physical Anesthesia Plan  ASA: III  Anesthesia Plan: General   Post-op Pain Management:    Induction: Intravenous, Rapid sequence and Cricoid pressure planned  PONV Risk Score and Plan: 3 and Ondansetron, Dexamethasone, Midazolam and Treatment may vary due to age or medical condition  Airway Management Planned: Oral ETT  Additional Equipment:   Intra-op Plan:   Post-operative Plan: Extubation in OR  Informed Consent: I have reviewed the patients History and Physical, chart, labs and discussed the procedure including the risks, benefits and alternatives for the proposed anesthesia with the patient or authorized representative who has indicated his/her understanding and acceptance.     Dental advisory given  Plan Discussed with: CRNA  Anesthesia Plan Comments:         Anesthesia Quick Evaluation

## 2019-07-06 NOTE — Anesthesia Postprocedure Evaluation (Signed)
Anesthesia Post Note  Patient: Denise Andrade  Procedure(s) Performed: LAPAROSCOPIC CHOLECYSTECTOMY (N/A )     Patient location during evaluation: PACU Anesthesia Type: General Level of consciousness: awake and alert Pain management: pain level controlled Vital Signs Assessment: post-procedure vital signs reviewed and stable Respiratory status: spontaneous breathing, nonlabored ventilation and respiratory function stable Cardiovascular status: blood pressure returned to baseline and stable Postop Assessment: no apparent nausea or vomiting Anesthetic complications: no    Last Vitals:  Vitals:   07/06/19 1415 07/06/19 1433  BP: 100/64 122/88  Pulse: 79 79  Resp: 12 14  Temp: 36.6 C 36.8 C  SpO2: 96% 94%    Last Pain:  Vitals:   07/06/19 1433  TempSrc: Oral  PainSc: [place]

## 2019-07-06 NOTE — Anesthesia Procedure Notes (Signed)
Procedure Name: Intubation Date/Time: 07/06/2019 11:33 AM Performed by: Maxwell Caul, CRNA Pre-anesthesia Checklist: Patient identified, Emergency Drugs available, Suction available and Patient being monitored Patient Re-evaluated:Patient Re-evaluated prior to induction Oxygen Delivery Method: Circle system utilized Preoxygenation: Pre-oxygenation with 100% oxygen Induction Type: IV induction Laryngoscope Size: Mac and 4 Grade View: Grade I Tube type: Oral Tube size: 7.5 mm Number of attempts: 1 Airway Equipment and Method: Stylet Placement Confirmation: ETT inserted through vocal cords under direct vision,  positive ETCO2 and breath sounds checked- equal and bilateral Secured at: 21 cm Tube secured with: Tape Dental Injury: Teeth and Oropharynx as per pre-operative assessment

## 2019-07-07 ENCOUNTER — Encounter (HOSPITAL_COMMUNITY): Payer: Self-pay | Admitting: General Surgery

## 2019-07-07 LAB — HIV ANTIBODY (ROUTINE TESTING W REFLEX): HIV Screen 4th Generation wRfx: NONREACTIVE

## 2019-07-07 MED ORDER — IBUPROFEN 200 MG PO TABS: ORAL_TABLET | ORAL | Status: AC

## 2019-07-07 MED ORDER — IBUPROFEN 200 MG PO TABS: 600.0000 mg | ORAL_TABLET | Freq: Four times a day (QID) | ORAL | Status: DC | PRN

## 2019-07-07 MED ORDER — ACETAMINOPHEN 500 MG PO TABS: ORAL_TABLET | ORAL | 0 refills | Status: AC

## 2019-07-07 MED ORDER — SENNOSIDES-DOCUSATE SODIUM 8.6-50 MG PO TABS: ORAL_TABLET | ORAL | 0 refills | Status: AC

## 2019-07-07 MED ORDER — OXYCODONE HCL 5 MG PO TABS: 5.0000 mg | ORAL_TABLET | Freq: Four times a day (QID) | ORAL | 0 refills | Status: AC | PRN

## 2019-07-07 MED ORDER — ACETAMINOPHEN 500 MG PO TABS
1000.0000 mg | ORAL_TABLET | Freq: Three times a day (TID) | ORAL | Status: DC
  Administered 2019-07-07: 10:00:00 1000 mg via ORAL
  Filled 2019-07-07: qty 2

## 2019-07-07 NOTE — Progress Notes (Signed)
Pt alert and oriented, tolerating diet.  D/C instructions given, all questions answered. Pt d/cd to home.

## 2019-07-07 NOTE — Plan of Care (Signed)
All goals met for d/c.

## 2019-07-07 NOTE — TOC Transition Note (Signed)
Transition of Care [facility]) - CM/SW Discharge Note   Patient Details  Name: Denise Andrade MRN: [id number] Date of Birth: 1976-09-06  Transition of [facility]) CM/SW Contact:  Leeroy Cha, RN Phone Number: 07/07/2019, 10:33 AM   Clinical Narrative:     dcd to home with slef care  Final next level of care: Home/Self Care Barriers to Discharge: No Barriers Identified   Patient Goals and CMS Choice Patient states their goals for this hospitalization and ongoing recovery are:: to go home CMS Medicare.gov Compare Post Acute Care list provided to:: Patient    Discharge Placement                       Discharge Plan and Services   Discharge Planning Services: CM Consult                                 Social Determinants of Health (SDOH) Interventions     Readmission Risk Interventions No flowsheet data found.

## 2019-07-07 NOTE — Discharge Summary (Signed)
Physician Discharge Summary  Patient ID: Denise Andrade MRN: [id number] DOB/AGE: Mar 11, 1976 43 y.o.  Admit date: 07/05/2019 Discharge date: 07/07/2019  Admission Diagnoses:  Symptomatic cholelithiasis/cholecystitis Hx C-section 12/20/2017 COVID negative  Discharge Diagnoses:  Same   Active Problems:   Gallstones   PROCEDURES: Laparoscopic cholecystectomy 07/06/2019 Dr. Marcina MillardFaera Byerly  Hospital Course:  Complaint: abd pain HPI: The patient is a 43 year old female who presents with upper abd pain that started around 4am. Pain has not subsided. Pain is associated with nausea and vomiting. She came to ER where CT shows gallstones. lft's normal Patient was seen in the emergency department admitted by Dr. Chevis PrettyPaul Toth.  She was placed on IV antibiotics and IV fluids.  She was made n.p.o. and taken to the operating room the following day.by Dr. Donell BeersByerly.  She had significant edema and swelling of her gallbladder.  She tolerated the procedure well and returned to the floor in satisfactory condition.  First postoperative day she is tolerating soft diet and ambulating well.  She had normal postoperative soreness, port sites all look good and she was ready for discharge after breakfast on the first postoperative day.  CBC Latest Ref Rng & Units 07/05/2019 12/21/2017 12/20/2017  WBC 4.0 - 10.5 K/uL 12.2(H) 14.4(H) 11.8(H)  Hemoglobin 12.0 - 15.0 g/dL 40.913.7 10.6(L) 12.4  Hematocrit 36.0 - 46.0 % 41.3 31.1(L) 36.8  Platelets 150 - 400 K/uL 251 237 257   CMP Latest Ref Rng & Units 07/05/2019 12/14/2017 11/05/2017  Glucose 70 - 99 mg/dL 811(B117(H) 86 87  BUN 6 - 20 mg/dL 11 11 12   Creatinine 0.44 - 1.00 mg/dL 1.470.61 8.290.74 5.620.56  Sodium 135 - 145 mmol/L 132(L) 136 135  Potassium 3.5 - 5.1 mmol/L 3.6 3.7 3.8  Chloride 98 - 111 mmol/L 103 106 110  CO2 22 - 32 mmol/L 23 19(L) 19(L)  Calcium 8.9 - 10.3 mg/dL 9.1 1.3(Y8.7(L) 8.6(V8.8(L)  Total Protein 6.5 - 8.1 g/dL 8.2(H) 6.9 6.8  Total Bilirubin 0.3 - 1.2 mg/dL 0.6 0.4 0.4   Alkaline Phos 38 - 126 U/L 89 236(H) 155(H)  AST 15 - 41 U/L 31 21 41  ALT 0 - 44 U/L 43 28 27   Condition on discharge: Improved   Disposition: Discharge disposition: 01-Home or Self Care        Allergies as of 07/07/2019   No Known Allergies     Medication List    STOP taking these medications   ferrous sulfate 325 (65 FE) MG tablet     TAKE these medications   acetaminophen 500 MG tablet Commonly known as: TYLENOL You can take 1000 mg of Tylenol every 8 hours.  I would use it on a continuous basis for the first 48 hours.  After that you can use it every 8 hours as needed.  This is your primary medication for pain relief.  You need additional pain relief you can start the ibuprofen about 2 hours after your first dose of Tylenol.  You can buy this over-the-counter at any drugstore.  Do not exceed 4000 mg of Tylenol per day it can harm your liver.   ibuprofen 200 MG tablet Commonly known as: ADVIL You can take 2 to 3 tablets every 6 hours as needed for pain.  This is your second pain relief medication.  You can buy this over-the-counter at any drugstore.  You can alternate Tylenol/acetaminophen and ibuprofen as your primary pain relief medications. What changed:   medication strength  how much to  take  how to take this  when to take this  additional instructions   oxyCODONE 5 MG immediate release tablet Commonly known as: Oxy IR/ROXICODONE Take 1 tablet (5 mg total) by mouth every 6 (six) hours as needed for severe pain or breakthrough pain.   senna-docusate 8.6-50 MG tablet Commonly known as: Senokot-S Use as needed for constipation What changed:   how much to take  how to take this  when to take this  additional instructions      Follow-up [facility] Call on 07/18/2019.   Specialty: General Surgery Why:  A provider will call you during scheduled appointment time at 11AM. Please send a photo of incisions,license, and  insurance card to [email or web address] the day prior to appointment.  Contact information: [place] [phone]       Department, [facility] Follow up.   Why: Call and follow up for Medical issues Contact information: [place] [id number] [phone]           Signed: Earnstine Regal 07/07/2019, 9:34 AM

## 2019-07-07 NOTE — Progress Notes (Signed)
1 Day Post-Op    CC: Abdominal pain  Subjective: Pretty sore but feeling much better this a.m.  Port sites all look good.  She is eating a regular breakfast.  We discussed pain control and she says she is ready for discharge.  Objective: Vital signs in last 24 hours: Temp:  [97.9 F (36.6 C)-104 F (40 C)] 98.2 F (36.8 C) (07/24 0545) Pulse Rate:  [71-88] 72 (07/24 0545) Resp:  [7-20] 18 (07/24 0545) BP: (96-122)/(58-88) 110/78 (07/24 0545) SpO2:  [92 %-100 %] 96 % (07/24 0545) Last BM Date: 07/05/19 840 p.o. 1300 IV  2100 urine Afebrile vital signs are stable No labs intake/Output from previous day: 07/23 0701 - 07/24 0700 In: 2168.6 [P.O.:840; I.V.:1228.6; IV Piggyback:100] Out: 2125 [Urine:2100; Blood:25] Intake/Output this shift: No intake/output data recorded.  General appearance: alert, cooperative and no distress Resp: clear to auscultation bilaterally GI: Soft, sore, port sites all look fine.  She is tolerating a regular breakfast.  Lab Results:  Recent Labs    07/05/19 1723  WBC 12.2*  HGB 13.7  HCT 41.3  PLT 251    BMET Recent Labs    07/05/19 1723  NA 132*  K 3.6  CL 103  CO2 23  GLUCOSE 117*  BUN 11  CREATININE 0.61  CALCIUM 9.1   PT/INR No results for input(s): LABPROT, INR in the last 72 hours.  Recent Labs  Lab 07/05/19 1723  AST 31  ALT 43  ALKPHOS 89  BILITOT 0.6  PROT 8.2*  ALBUMIN 4.1     Lipase     Component Value Date/Time   LIPASE 34 07/05/2019 1723     Medications:   Assessment/Plan COVID negative Hx C-section 12/20/2017  Acute cholecystitis/cholelithiasis Laparoscopic cholecystectomy 07/06/2019, Dr. Stark Klein  FEN: IV fluids/regular diet  ID: Rocephin 7/22 >> day 1(next dose 2200 hrs) DVT: SCDs Follow-up: [facility] POC: [facility]   [phone]  [place], [place] [phone]     Plan: Add plain Tylenol and ibuprofen to her pain regime.  Mobilize and discharge later this a.m.       LOS: 0 days    Laqueena Hinchey 07/07/2019 [phone]

## 2019-09-28 ENCOUNTER — Emergency Department (HOSPITAL_COMMUNITY): Payer: 59

## 2019-09-28 ENCOUNTER — Other Ambulatory Visit: Payer: Self-pay

## 2019-09-28 ENCOUNTER — Emergency Department (HOSPITAL_COMMUNITY)
Admission: EM | Admit: 2019-09-28 | Discharge: 2019-09-28 | Disposition: A | Discharge status: Home / Self Care | Payer: 59 | Attending: Emergency Medicine | Admitting: Emergency Medicine

## 2019-09-28 ENCOUNTER — Encounter (HOSPITAL_COMMUNITY): Payer: Self-pay

## 2019-09-28 DIAGNOSIS — R1013 Epigastric pain: Secondary | ICD-10-CM | POA: Diagnosis present

## 2019-09-28 DIAGNOSIS — Z87891 Personal history of nicotine dependence: Secondary | ICD-10-CM | POA: Diagnosis not present

## 2019-09-28 DIAGNOSIS — R112 Nausea with vomiting, unspecified: Secondary | ICD-10-CM | POA: Diagnosis not present

## 2019-09-28 LAB — COMPREHENSIVE METABOLIC PANEL
ALT: 48 U/L — ABNORMAL HIGH (ref 0–44)
AST: 34 U/L (ref 15–41)
Albumin: 3.6 g/dL (ref 3.5–5.0)
Alkaline Phosphatase: 110 U/L (ref 38–126)
Anion gap: 8 (ref 5–15)
BUN: 15 mg/dL (ref 6–20)
CO2: 22 mmol/L (ref 22–32)
Calcium: 9.1 mg/dL (ref 8.9–10.3)
Chloride: 105 mmol/L (ref 98–111)
Creatinine, Ser: 0.67 mg/dL (ref 0.44–1.00)
GFR calc Af Amer: >60 mL/min (ref >60)
GFR calc non Af Amer: >60 mL/min (ref >60)
Glucose, Bld: 100 mg/dL — ABNORMAL HIGH (ref 70–99)
Potassium: 3.9 mmol/L (ref 3.5–5.1)
Sodium: 135 mmol/L (ref 135–145)
Total Bilirubin: 0.7 mg/dL (ref 0.3–1.2)
Total Protein: 7.6 g/dL (ref 6.5–8.1)

## 2019-09-28 LAB — I-STAT BETA HCG BLOOD, ED (MC, WL, AP ONLY): I-stat hCG, quantitative: <5 m[IU]/mL (ref <5)

## 2019-09-28 LAB — CBC
HCT: 42.7 % (ref 36.0–46.0)
Hemoglobin: 13.8 g/dL (ref 12.0–15.0)
MCH: 30.1 pg (ref 26.0–34.0)
MCHC: 32.3 g/dL (ref 30.0–36.0)
MCV: 93.2 fL (ref 80.0–100.0)
Platelets: 294 10*3/uL (ref 150–400)
RBC: 4.58 MIL/uL (ref 3.87–5.11)
RDW: 12.3 % (ref 11.5–15.5)
WBC: 8.6 10*3/uL (ref 4.0–10.5)
nRBC: 0 % (ref 0.0–0.2)

## 2019-09-28 LAB — LIPASE, BLOOD: Lipase: 34 U/L (ref 11–51)

## 2019-09-28 MED ORDER — LIDOCAINE VISCOUS HCL 2 % MT SOLN
15.0000 mL | Freq: Once | OROMUCOSAL | Status: AC
  Administered 2019-09-28: 15 mL via OROMUCOSAL
  Filled 2019-09-28: qty 15

## 2019-09-28 MED ORDER — SODIUM CHLORIDE (PF) 0.9 % IJ SOLN
INTRAMUSCULAR | Status: AC
  Filled 2019-09-28: qty 50

## 2019-09-28 MED ORDER — ALUM & MAG HYDROXIDE-SIMETH 200-200-20 MG/5ML PO SUSP
15.0000 mL | Freq: Once | ORAL | Status: AC
  Administered 2019-09-28: 15 mL via ORAL
  Filled 2019-09-28: qty 30

## 2019-09-28 MED ORDER — IOHEXOL 300 MG/ML  SOLN
100.0000 mL | Freq: Once | INTRAMUSCULAR | Status: AC | PRN
  Administered 2019-09-28: 100 mL via INTRAVENOUS

## 2019-09-28 MED ORDER — FAMOTIDINE IN NACL 20-0.9 MG/50ML-% IV SOLN
20.0000 mg | Freq: Once | INTRAVENOUS | Status: AC
  Administered 2019-09-28: 20 mg via INTRAVENOUS
  Filled 2019-09-28: qty 50

## 2019-09-28 MED ORDER — SODIUM CHLORIDE 0.9% FLUSH
3.0000 mL | Freq: Once | INTRAVENOUS | Status: AC
  Administered 2019-09-28: 3 mL via INTRAVENOUS

## 2019-09-28 MED ORDER — SUCRALFATE 1 G PO TABS: 1.0000 g | ORAL_TABLET | Freq: Three times a day (TID) | ORAL | 0 refills | Status: AC

## 2019-09-28 MED ORDER — ONDANSETRON HCL 4 MG/2ML IJ SOLN
4.0000 mg | Freq: Once | INTRAMUSCULAR | Status: AC
  Administered 2019-09-28: 4 mg via INTRAVENOUS
  Filled 2019-09-28: qty 2

## 2019-09-28 MED ORDER — ONDANSETRON 4 MG PO TBDP: 4.0000 mg | ORAL_TABLET | Freq: Three times a day (TID) | ORAL | 0 refills | Status: AC | PRN

## 2019-09-28 MED ORDER — KETOROLAC TROMETHAMINE 15 MG/ML IJ SOLN
15.0000 mg | Freq: Once | INTRAMUSCULAR | Status: AC
  Administered 2019-09-28: 15 mg via INTRAVENOUS
  Filled 2019-09-28: qty 1

## 2019-09-28 NOTE — ED Triage Notes (Signed)
Patient states she had a cholecystectomy on 07/06/19. Patient states she has had mid upper abdominal pain and N/v every single day since her surgery. Patient states she was told by her surgeon to get Omeprazole, but states she has not had any relief. Patient states her pain gets so bad that she makes herself throw up to get relief.

## 2019-09-28 NOTE — Discharge Instructions (Signed)
Your abdominal pain is likely from gastritis, reflux or a stomach ulcer. You will need to take the prescribed proton pump inhibitor as directed, and avoid spicy/fatty/acidic foods. Avoid laying down flat within 30 minutes of eating. Avoid NSAIDs like ibuprofen or Aleve on an empty stomach. Use zofran as needed for nausea. Follow up with the gastroenterologist (GI doctor) listed for ongoing evaluation of your abdominal pain. Return to the ER for new or worsening symptoms, any additional concers.   SEEK IMMEDIATE MEDICAL ATTENTION IF YOU DEVELOP ANY OF THE FOLLOWING SYMPTOMS: The pain does not go away or becomes severe.  A temperature above 101 develops.  Repeated vomiting occurs (multiple episodes).  Blood is being passed in stools or vomit (bright red or black tarry stools).  Return also if you develop chest pain, difficulty breathing, dizziness or fainting

## 2019-09-28 NOTE — ED Provider Notes (Signed)
[facility]-EMERGENCY DEPT Provider Note   CSN: [id number] Arrival date & time: 09/28/19  1151     History   Chief Complaint Chief Complaint  Patient presents with   Abdominal Pain    HPI Denise Andrade is a 43 y.o. female.     HPI 43 year old female presents to the emergency department today for evaluation of ongoing upper abdominal pain with associated nausea and vomiting.  Patient reports that she had her gallbladder removed in July 2020.  She states that since then she has been having ongoing daily upper abdominal pain with associated nausea and vomiting.  She states the vomiting will sometimes relieve the pain.  Patient states that the pain has becoming much more severe and constant.  She rates the pain a 9/10.  States that it is a dull sharp pain.  Nonradiating.  Does not appear to be associated with food.  She does report acid reflux.  Talked to her general surgeon who recommended she try omeprazole which she has been taking for 3 weeks without any significant relief of this.  Patient reports normal bowel movements.  Denies any urinary symptoms.  No fevers or chills.  She does take Tylenol for the pain but states that does not help.  Patient denies any other abdominal surgery.  No alleviating or aggravating factors. Past Medical History:  Diagnosis Date   Medical history non-contributory     Patient Active Problem List   Diagnosis Date Noted   Gallstones 07/05/2019   Indication for care in labor or delivery 12/20/2017   S/P primary low transverse C-section 12/20/2017    Past Surgical History:  Procedure Laterality Date   CESAREAN SECTION N/A 12/20/2017   Procedure: CESAREAN SECTION;  Surgeon: Smithfield Bing, MD;  Location: [facility] BIRTHING SUITES;  Service: Obstetrics;  Laterality: N/A;   CHOLECYSTECTOMY N/A 07/06/2019   Procedure: LAPAROSCOPIC CHOLECYSTECTOMY;  Surgeon: Almond Lint, MD;  Location: [facility] ORS;  Service: General;  Laterality: N/A;    NO PAST SURGERIES       OB History    Gravida  4   Para  4   Term  4   Preterm      AB      Living  4     SAB      TAB      Ectopic      Multiple  0   Live Births  1            Home Medications    Prior to Admission medications   Medication Sig Start Date End Date Taking? Authorizing Provider  acetaminophen (TYLENOL) 500 MG tablet You can take 1000 mg of Tylenol every 8 hours.  I would use it on a continuous basis for the first 48 hours.  After that you can use it every 8 hours as needed.  This is your primary medication for pain relief.  You need additional pain relief you can start the ibuprofen about 2 hours after your first dose of Tylenol.  You can buy this over-the-counter at any drugstore.  Do not exceed 4000 mg of Tylenol per day it can harm your liver. 07/07/19   Sherrie George, PA-C  ibuprofen (ADVIL) 200 MG tablet You can take 2 to 3 tablets every 6 hours as needed for pain.  This is your second pain relief medication.  You can buy this over-the-counter at any drugstore.  You can alternate Tylenol/acetaminophen and ibuprofen as your primary pain relief medications. 07/07/19  Earnstine Regal, PA-C  oxyCODONE (OXY IR/ROXICODONE) 5 MG immediate release tablet Take 1 tablet (5 mg total) by mouth every 6 (six) hours as needed for severe pain or breakthrough pain. 07/07/19   Earnstine Regal, PA-C  senna-docusate (SENOKOT-S) 8.6-50 MG tablet Use as needed for constipation 07/07/19   Earnstine Regal, PA-C    Family History Family History  Problem Relation Age of Onset   Rheum arthritis Mother     Social History Social History   Tobacco Use   Smoking status: Former Smoker   Smokeless tobacco: Never Used  Substance Use Topics   Alcohol use: Yes    Frequency: Never    Comment: Social   Drug use: No     Allergies   Patient has no known allergies.   Review of Systems Review of Systems  Constitutional: Negative for chills and fever.  HENT:  Negative for congestion.   Eyes: Negative for discharge.  Respiratory: Negative for shortness of breath.   Cardiovascular: Negative for chest pain.  Gastrointestinal: Positive for abdominal pain, nausea and vomiting. Negative for blood in stool, constipation and diarrhea.  Genitourinary: Negative for dysuria, flank pain, frequency and hematuria.  Musculoskeletal: Negative for myalgias.  Skin: Negative for color change.  Neurological: Negative for headaches.  Psychiatric/Behavioral: Negative for confusion.     Physical Exam Updated Vital Signs BP (!) 133/91 (BP Location: Left Arm)    Pulse 70    Temp 98.5 F (36.9 C) (Oral)    Resp 17    Ht 5\' 2"  (1.575 m)    Wt 103.1 kg    LMP 09/23/2019    SpO2 100%    BMI 41.56 kg/m   Physical Exam Vitals signs and nursing note reviewed.  Constitutional:      General: She is not in acute distress.    Appearance: She is well-developed. She is not ill-appearing or toxic-appearing.  HENT:     Head: Normocephalic and atraumatic.     Nose: Nose normal.  Eyes:     General:        Right eye: No discharge.        Left eye: No discharge.     Conjunctiva/sclera: Conjunctivae normal.     Pupils: Pupils are equal, round, and reactive to light.  Neck:     Musculoskeletal: Normal range of motion and neck supple.  Cardiovascular:     Rate and Rhythm: Normal rate and regular rhythm.     Heart sounds: Normal heart sounds.  Pulmonary:     Effort: Pulmonary effort is normal. No respiratory distress.     Breath sounds: Normal breath sounds.  Chest:     Chest wall: No tenderness.  Abdominal:     General: Bowel sounds are normal.     Palpations: Abdomen is soft.     Tenderness: There is abdominal tenderness in the right upper quadrant and epigastric area. There is no right CVA tenderness, left CVA tenderness, guarding or rebound. Negative signs include Murphy's sign, Rovsing's sign and McBurney's sign.  Musculoskeletal: Normal range of motion.         General: No tenderness.  Lymphadenopathy:     Cervical: No cervical adenopathy.  Skin:    General: Skin is warm and dry.     Capillary Refill: Capillary refill takes less than 2 seconds.  Neurological:     Mental Status: She is alert and oriented to person, place, and time.  Psychiatric:        Behavior: Behavior normal.  Thought Content: Thought content normal.        Judgment: Judgment normal.      ED Treatments / Results  Labs (all labs ordered are listed, but only abnormal results are displayed) Labs Reviewed  COMPREHENSIVE METABOLIC PANEL - Abnormal; Notable for the following components:      Result Value   Glucose, Bld 100 (*)    ALT 48 (*)    All other components within normal limits  LIPASE, BLOOD  CBC  URINALYSIS, ROUTINE W REFLEX MICROSCOPIC  I-STAT BETA HCG BLOOD, ED ([facility], [facility], [facility] ONLY)    EKG None  Radiology Ct Abdomen Pelvis W Contrast  Result Date: 09/28/2019 CLINICAL DATA:  43 year old female with abdominal pain. EXAM: CT ABDOMEN AND PELVIS WITH CONTRAST TECHNIQUE: Multidetector CT imaging of the abdomen and pelvis was performed using the standard protocol following bolus administration of intravenous contrast. CONTRAST:  100mL OMNIPAQUE IOHEXOL 300 MG/ML  SOLN COMPARISON:  CT of the abdomen pelvis dated 07/05/2019 FINDINGS: Lower chest: The visualized lung bases are clear. No intra-abdominal free air or free fluid. Hepatobiliary: The liver is unremarkable. No intrahepatic biliary ductal dilatation. Cholecystectomy. There is a 3.8 x 2.6 cm fluid collection along the cholecystectomy bed distal to the cholecystectomy clips. This may represent a postsurgical seroma. However, a developing abscess/infected collection or a biloma are not excluded. Clinical correlation is recommended. Pancreas: Unremarkable. No pancreatic ductal dilatation or surrounding inflammatory changes. Spleen: Normal in size without focal abnormality. Adrenals/Urinary Tract: The adrenal glands  are unremarkable. There is a 2 mm nonobstructing right renal upper pole calculus. No hydronephrosis. The left kidney is unremarkable. There is symmetric enhancement and excretion of contrast by both kidneys. The visualized ureters and urinary bladder appear unremarkable. Stomach/Bowel: There is no bowel obstruction or active inflammation. The appendix is normal. Vascular/Lymphatic: The abdominal aorta and IVC appear unremarkable. No portal venous gas. Mildly enlarged periportal lymph nodes measure up to 2 cm in short axis, likely reactive. Several mildly enlarged upper abdominal lymph nodes along the gastrohepatic ligament as well as adjacent to the distal esophagus noted. These are relatively similar to prior CT. Reproductive: The uterus and ovaries are grossly unremarkable. There is a 12 mm right ovarian dominant follicle or cyst. Other: None Musculoskeletal: Disc desiccation at L5-S1. No acute osseous pathology. L1 hemangioma. IMPRESSION: 1. Cholecystectomy. Small fluid collection along the cholecystectomy bed may represent seroma. A developing abscess or biloma is not excluded. Clinical correlation is recommended. 2. A 2 mm nonobstructing right renal calculus.  No hydronephrosis. 3. No bowel obstruction or active inflammation.  Normal appendix. 4. Mildly enlarged periportal lymph nodes, likely reactive. Electronically Signed   By: Elgie CollardArash  Radparvar M.D.   On: 09/28/2019 16:45    Procedures Procedures (including critical care time)  Medications Ordered in ED Medications  famotidine (PEPCID) IVPB 20 mg premix (20 mg Intravenous New Bag/Given (Non-Interop) 09/28/19 1525)  sodium chloride flush (NS) 0.9 % injection 3 mL (3 mLs Intravenous Given 09/28/19 1526)  lidocaine (XYLOCAINE) 2 % viscous mouth solution 15 mL (15 mLs Mouth/Throat Given 09/28/19 1525)  alum & mag hydroxide-simeth (MAALOX/MYLANTA) 200-200-20 MG/5ML suspension 15 mL (15 mLs Oral Given 09/28/19 1525)  ketorolac (TORADOL) 15 MG/ML  injection 15 mg (15 mg Intravenous Given 09/28/19 1525)     Initial Impression / Assessment and Plan / ED Course  I have reviewed the triage vital signs and the nursing notes.  Pertinent labs & imaging results that were available during my care of the patient were reviewed  by me and considered in my medical decision making (see chart for details).        Patient is nontoxic, nonseptic appearing, in no apparent distress.  Patient's pain and other symptoms adequately managed in emergency department.  Fluid bolus given.  Labs, imaging and vitals reviewed.  No leukocytosis.  Normal lipase.  No significant elevation in transaminases.  Imaging shows prior cholecystectomy does have a small fluid collection that likely represents a seroma however developing abscess or biloma is not excluded.  No other acute findings were noted.  I did discuss case with Dr. Maisie Fus with general surgery.  She does not feel that this represents an abscess or biloma given longevity of symptoms and patient is afebrile with no leukocytosis.  I agree with Dr. Maurine Minister recommendation.  Patient does not meet the SIRS or Sepsis criteria.  On repeat exam patient does not have a surgical abdomin and there are no peritoneal signs.  No indication of appendicitis, bowel obstruction, bowel perforation, cholecystitis, diverticulitis, PID or ectopic pregnancy.  Symptoms seem consistent with likely gastritis versus peptic ulcer disease.  Patient had complete resolution of symptoms with medication in the ER.  Will discharge home with Pepcid, Zofran and Carafate.  Patient to continue PPI.  Have given GI follow-up.  Patient discharged home with symptomatic treatment and given strict instructions for follow-up with their primary care physician.  I have also discussed reasons to return immediately to the ER.  Patient expresses understanding and agrees with plan.       Final Clinical Impressions(s) / ED Diagnoses   Final diagnoses:  Epigastric  pain  Nausea and vomiting, intractability of vomiting not specified, unspecified vomiting type    ED Discharge Orders         Ordered    ondansetron (ZOFRAN ODT) 4 MG disintegrating tablet  Every 8 hours PRN     09/28/19 1726    sucralfate (CARAFATE) 1 g tablet  3 times daily with meals & bedtime     09/28/19 1726           Rise Mu, PA-C 09/28/19 1730    Lorre Nick, MD 10/02/19 1219

## 2019-09-28 NOTE — ED Notes (Signed)
ED Provider at bedside. 

## 2019-09-29 ENCOUNTER — Encounter: Payer: Self-pay | Admitting: Gynecology

## 2020-07-05 IMAGING — CT CT ABD-PELV W/ CM
2 of 5 series · 16 of 46 positions shown, 18 images · IV contrast (omnipaque)
Comparison: CT of the abdomen pelvis dated 07/05/2019

CLINICAL DATA: 43-year-old female with abdominal pain.

EXAM:
CT ABDOMEN AND PELVIS WITH CONTRAST
TECHNIQUE: Multidetector CT imaging of the abdomen and pelvis was performed
using the standard protocol following bolus administration of
intravenous contrast.
CONTRAST:  100mL OMNIPAQUE IOHEXOL 300 MG/ML  SOLN

[Series 2: axial st · axial · 0.81mm/px · z∈[+1253,+1653]mm · 13 of 94 slices shown, 15 images]
[im 7/94  soft-tissue]
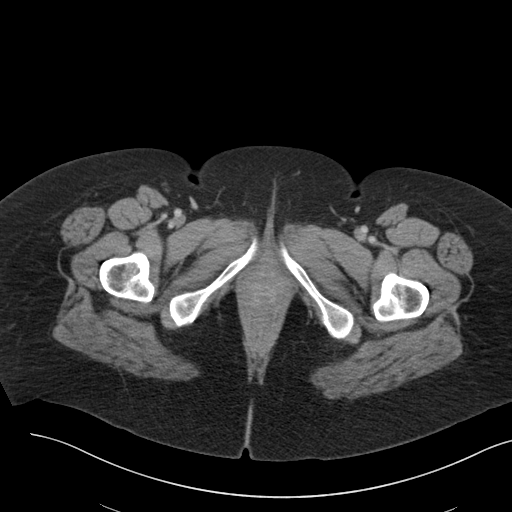
[im 7/94  bone]
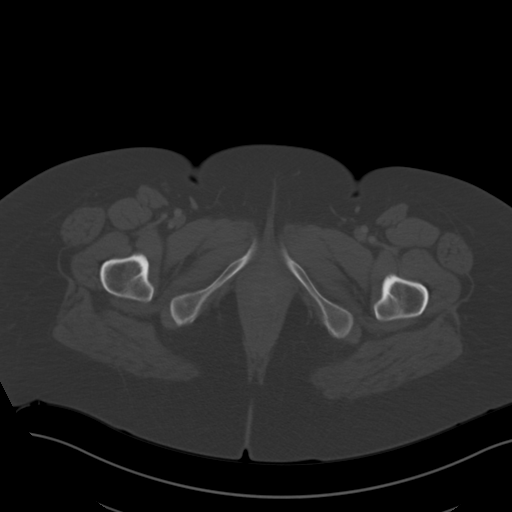
[im 14/94  soft-tissue]
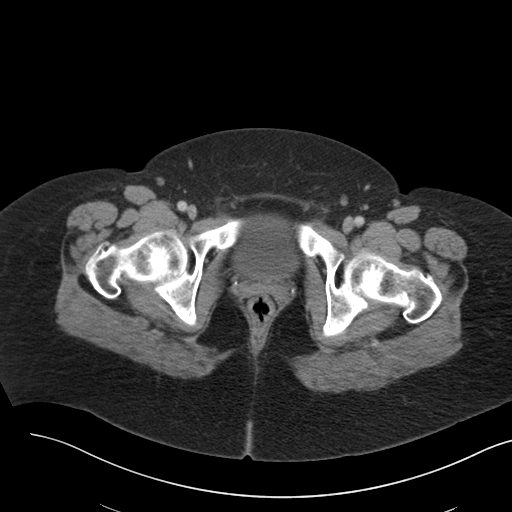
[im 20/94  soft-tissue]
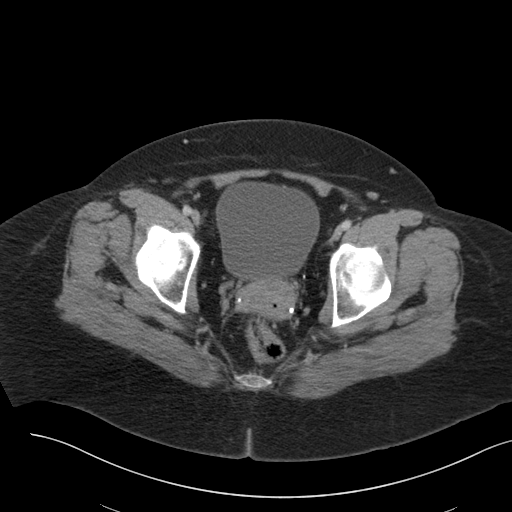
[im 27/94  soft-tissue]
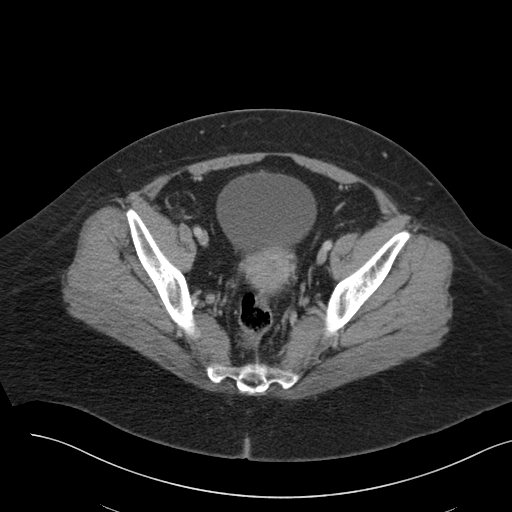
[im 34/94  soft-tissue]
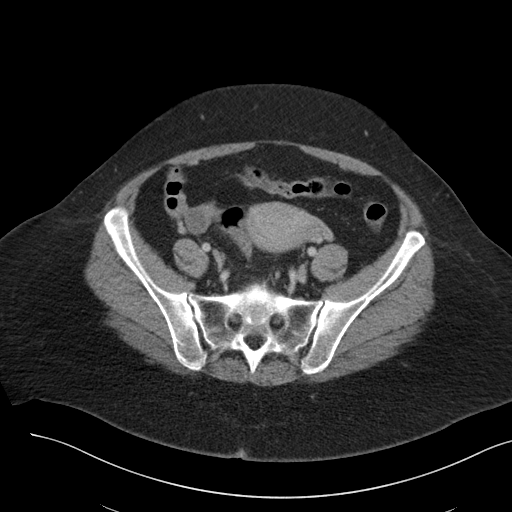
[im 40/94  soft-tissue]
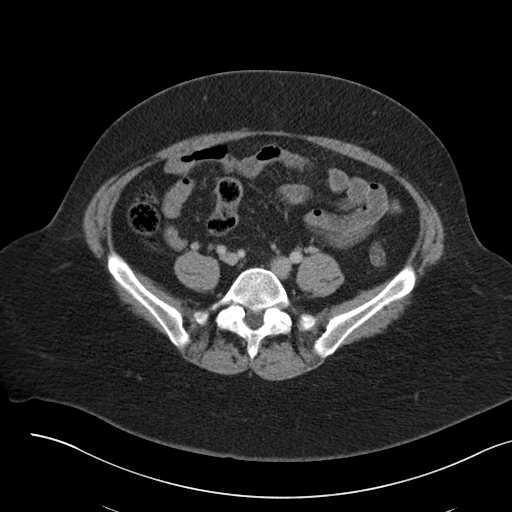
[im 47/94  soft-tissue]
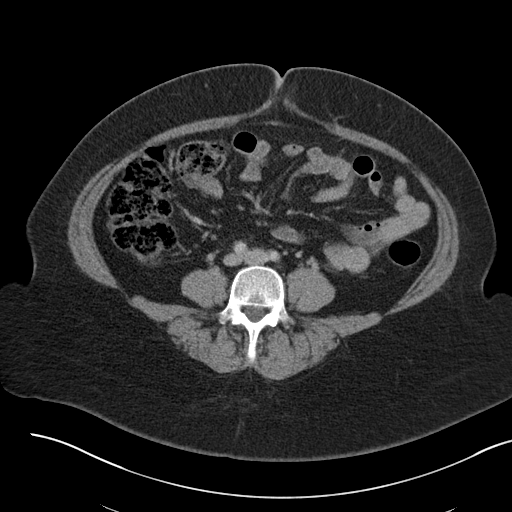
[im 54/94  soft-tissue]
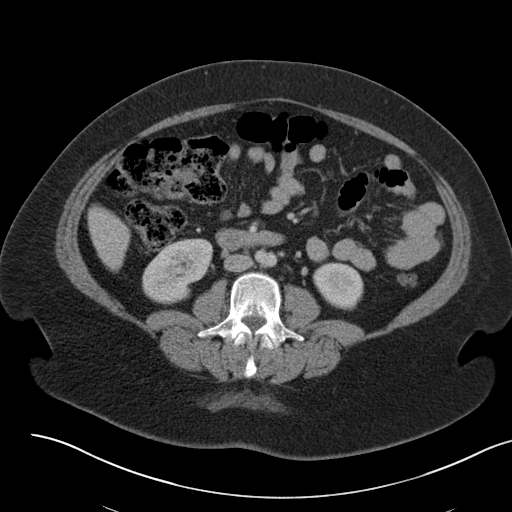
[im 60/94  soft-tissue]
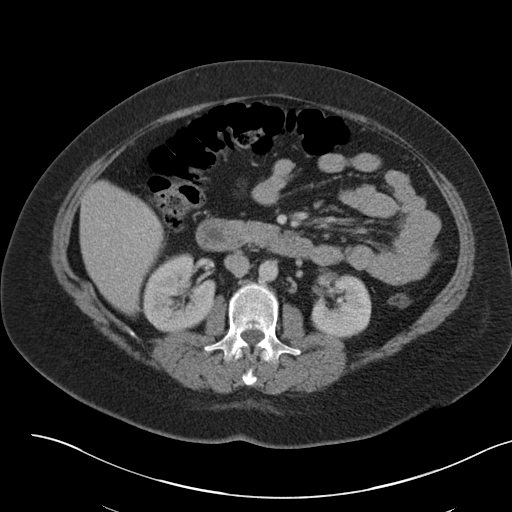
[im 60/94  bone]
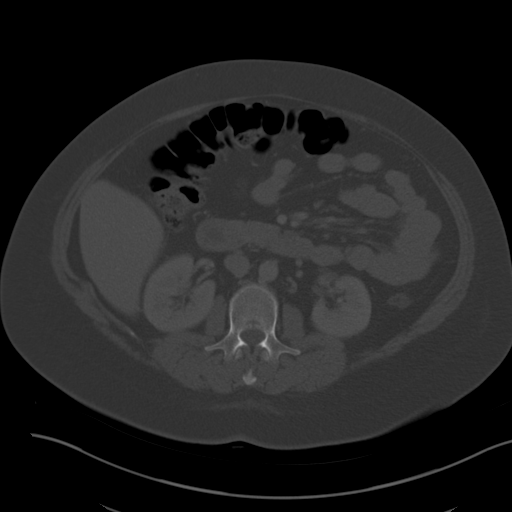
[im 67/94  soft-tissue]
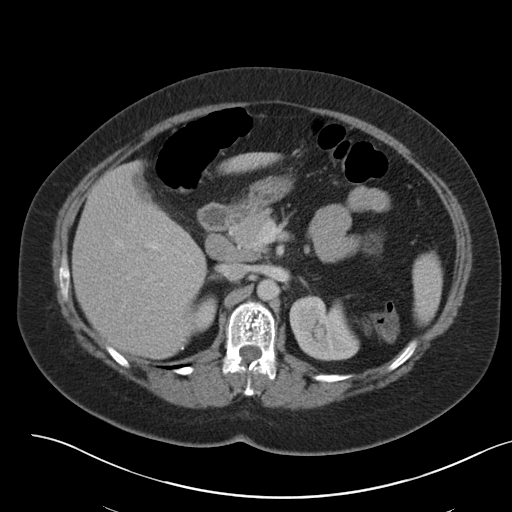
[im 74/94  soft-tissue]
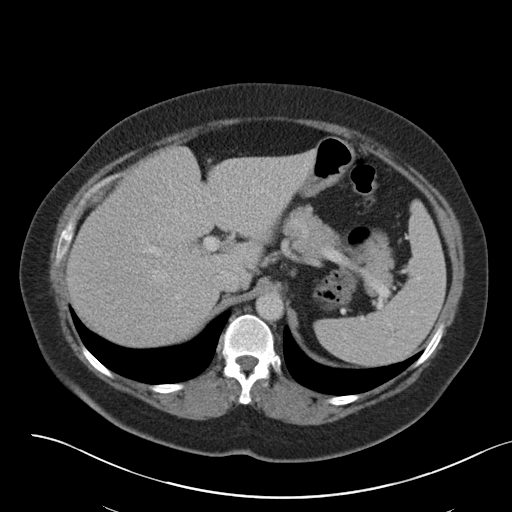
[im 80/94  soft-tissue]
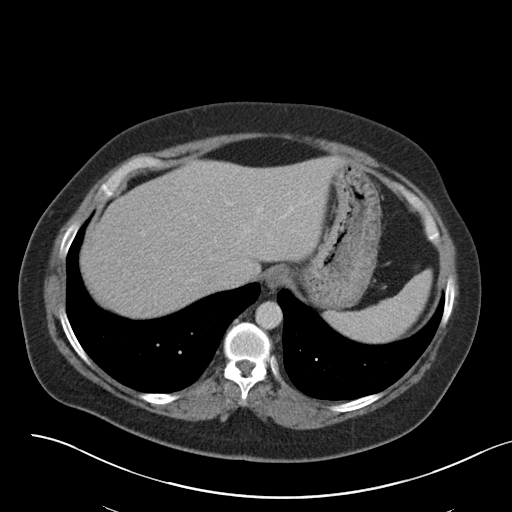
[im 87/94  soft-tissue]
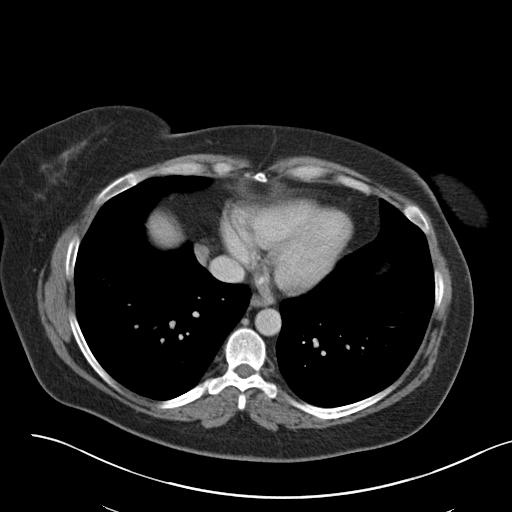

[Series 4: coronal st · coronal · 0.97mm/px · 3 of 139 slices shown]
[im 47/139  soft-tissue]
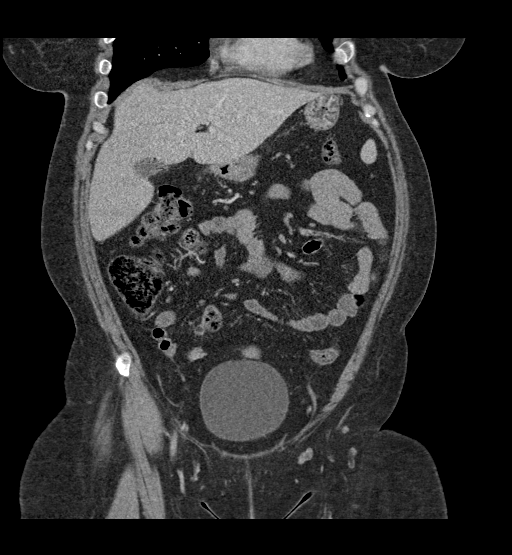
[im 62/139  soft-tissue]
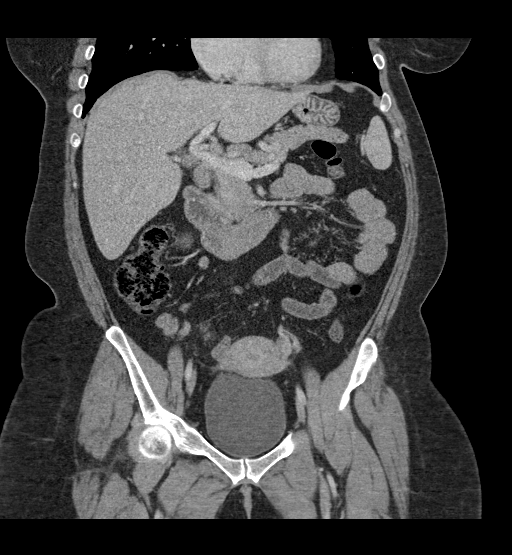
[im 77/139  soft-tissue]
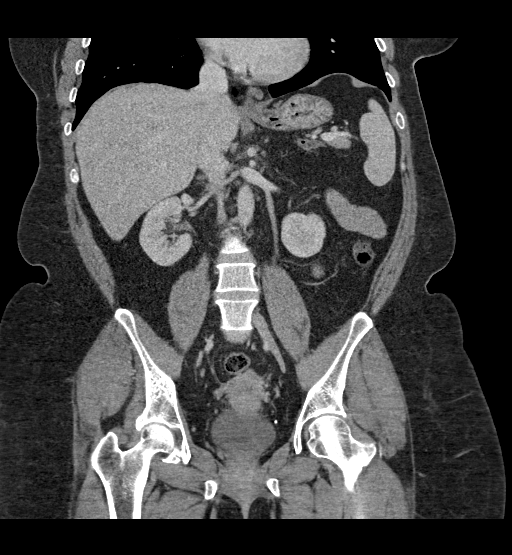

[16 of 46 positions shown; findings below may reference images not displayed]

FINDINGS: Lower chest: The visualized lung bases are clear.

No intra-abdominal free air or free fluid.

Hepatobiliary: The liver is unremarkable. No intrahepatic biliary
ductal dilatation. Cholecystectomy. There is a 3.8 x 2.6 cm fluid
collection along the cholecystectomy bed distal to the
cholecystectomy clips. This may represent a postsurgical seroma.
However, a developing abscess/infected collection or a biloma are
not excluded. Clinical correlation is recommended.

Pancreas: Unremarkable. No pancreatic ductal dilatation or
surrounding inflammatory changes.

Spleen: Normal in size without focal abnormality.

Adrenals/Urinary Tract: The adrenal glands are unremarkable. There
is a 2 mm nonobstructing right renal upper pole calculus. No
hydronephrosis. The left kidney is unremarkable. There is symmetric
enhancement and excretion of contrast by both kidneys. The
visualized ureters and urinary bladder appear unremarkable.

Stomach/Bowel: There is no bowel obstruction or active inflammation.
The appendix is normal.

Vascular/Lymphatic: The abdominal aorta and IVC appear unremarkable.
No portal venous gas. Mildly enlarged periportal lymph nodes measure
up to 2 cm in short axis, likely reactive. Several mildly enlarged
upper abdominal lymph nodes along the gastrohepatic ligament as well
as adjacent to the distal esophagus noted. These are relatively
similar to prior CT.

Reproductive: The uterus and ovaries are grossly unremarkable. There
is a 12 mm right ovarian dominant follicle or cyst.

Other: None

Musculoskeletal: Disc desiccation at L5-S1. No acute osseous
pathology. L1 hemangioma.
IMPRESSION: 1. Cholecystectomy. Small fluid collection along the cholecystectomy
bed may represent seroma. A developing abscess or biloma is not
excluded. Clinical correlation is recommended.
2. A 2 mm nonobstructing right renal calculus.  No hydronephrosis.
3. No bowel obstruction or active inflammation.  Normal appendix.
4. Mildly enlarged periportal lymph nodes, likely reactive.
